# Patient Record
Sex: Male | Born: 1965 | Race: White | Hispanic: No | Marital: Single | Smoking: Current every day smoker
Health system: Southern US, Community
[De-identification: ages and names within clinical notes are randomized; demographics above are authoritative.]

## PROBLEM LIST (undated history)

## (undated) DIAGNOSIS — M1990 Unspecified osteoarthritis, unspecified site: Secondary | ICD-10-CM

## (undated) DIAGNOSIS — J45909 Unspecified asthma, uncomplicated: Secondary | ICD-10-CM

## (undated) DIAGNOSIS — F32A Depression, unspecified: Secondary | ICD-10-CM

## (undated) DIAGNOSIS — R011 Cardiac murmur, unspecified: Secondary | ICD-10-CM

## (undated) DIAGNOSIS — Z7289 Other problems related to lifestyle: Secondary | ICD-10-CM

## (undated) DIAGNOSIS — F419 Anxiety disorder, unspecified: Secondary | ICD-10-CM

## (undated) DIAGNOSIS — F329 Major depressive disorder, single episode, unspecified: Secondary | ICD-10-CM

## (undated) HISTORY — PX: FRACTURE SURGERY: SHX138

## (undated) HISTORY — PX: HERNIA REPAIR: SHX51

## (undated) HISTORY — PX: ABDOMINAL SURGERY: SHX537

---

## 2004-06-30 ENCOUNTER — Emergency Department: Payer: Self-pay | Admitting: Emergency Medicine

## 2015-01-12 ENCOUNTER — Emergency Department: Payer: Medicaid Other

## 2015-01-12 ENCOUNTER — Encounter: Payer: Self-pay | Admitting: Emergency Medicine

## 2015-01-12 ENCOUNTER — Emergency Department
Admission: EM | Admit: 2015-01-12 | Discharge: 2015-01-18 | Disposition: A | Discharge status: Other Acute Inpatient Hospital | Payer: Medicaid Other | Attending: Emergency Medicine | Admitting: Emergency Medicine

## 2015-01-12 ENCOUNTER — Ambulatory Visit
Admission: EM | Admit: 2015-01-12 | Discharge: 2015-01-12 | Disposition: A | Discharge status: Home / Self Care | Payer: Medicaid Other | Attending: Emergency Medicine | Admitting: Emergency Medicine

## 2015-01-12 ENCOUNTER — Ambulatory Visit: Payer: Medicaid Other | Admitting: Certified Registered"

## 2015-01-12 ENCOUNTER — Encounter
Admission: EM | Disposition: A | Discharge status: Still In Hospital | Payer: Self-pay | Source: Home / Self Care | Attending: Emergency Medicine

## 2015-01-12 ENCOUNTER — Encounter
Admission: EM | Disposition: A | Discharge status: Other Acute Inpatient Hospital | Payer: Self-pay | Source: Home / Self Care | Attending: Emergency Medicine

## 2015-01-12 ENCOUNTER — Ambulatory Visit: Admit: 2015-01-12 | Payer: Self-pay | Admitting: Gastroenterology

## 2015-01-12 DIAGNOSIS — T182XXA Foreign body in stomach, initial encounter: Secondary | ICD-10-CM | POA: Insufficient documentation

## 2015-01-12 DIAGNOSIS — S41111A Laceration without foreign body of right upper arm, initial encounter: Secondary | ICD-10-CM

## 2015-01-12 DIAGNOSIS — F99 Mental disorder, not otherwise specified: Secondary | ICD-10-CM | POA: Diagnosis present

## 2015-01-12 DIAGNOSIS — Y9389 Activity, other specified: Secondary | ICD-10-CM | POA: Insufficient documentation

## 2015-01-12 DIAGNOSIS — S51811A Laceration without foreign body of right forearm, initial encounter: Secondary | ICD-10-CM | POA: Diagnosis not present

## 2015-01-12 DIAGNOSIS — Z538 Procedure and treatment not carried out for other reasons: Secondary | ICD-10-CM | POA: Diagnosis not present

## 2015-01-12 DIAGNOSIS — F603 Borderline personality disorder: Secondary | ICD-10-CM

## 2015-01-12 DIAGNOSIS — S41101A Unspecified open wound of right upper arm, initial encounter: Secondary | ICD-10-CM | POA: Insufficient documentation

## 2015-01-12 DIAGNOSIS — Y92149 Unspecified place in prison as the place of occurrence of the external cause: Secondary | ICD-10-CM | POA: Insufficient documentation

## 2015-01-12 DIAGNOSIS — X58XXXA Exposure to other specified factors, initial encounter: Secondary | ICD-10-CM | POA: Diagnosis not present

## 2015-01-12 DIAGNOSIS — J45909 Unspecified asthma, uncomplicated: Secondary | ICD-10-CM | POA: Diagnosis not present

## 2015-01-12 DIAGNOSIS — T189XXA Foreign body of alimentary tract, part unspecified, initial encounter: Secondary | ICD-10-CM

## 2015-01-12 DIAGNOSIS — Z720 Tobacco use: Secondary | ICD-10-CM | POA: Diagnosis not present

## 2015-01-12 DIAGNOSIS — Y998 Other external cause status: Secondary | ICD-10-CM | POA: Insufficient documentation

## 2015-01-12 DIAGNOSIS — Z7289 Other problems related to lifestyle: Secondary | ICD-10-CM

## 2015-01-12 DIAGNOSIS — F17210 Nicotine dependence, cigarettes, uncomplicated: Secondary | ICD-10-CM | POA: Insufficient documentation

## 2015-01-12 DIAGNOSIS — F1210 Cannabis abuse, uncomplicated: Secondary | ICD-10-CM

## 2015-01-12 HISTORY — DX: Other problems related to lifestyle: Z72.89

## 2015-01-12 LAB — CBC WITH DIFFERENTIAL/PLATELET
Basophils Absolute: 0.1 10*3/uL (ref 0–0.1)
Basophils Relative: 1 %
EOS ABS: 0.2 10*3/uL (ref 0–0.7)
EOS PCT: 3 %
HCT: 43.1 % (ref 40.0–52.0)
Hemoglobin: 14.3 g/dL (ref 13.0–18.0)
LYMPHS PCT: 12 %
Lymphs Abs: 0.8 10*3/uL — ABNORMAL LOW (ref 1.0–3.6)
MCH: 26.7 pg (ref 26.0–34.0)
MCHC: 33.3 g/dL (ref 32.0–36.0)
MCV: 80.1 fL (ref 80.0–100.0)
MONOS PCT: 7 %
Monocytes Absolute: 0.4 10*3/uL (ref 0.2–1.0)
Neutro Abs: 4.8 10*3/uL (ref 1.4–6.5)
Neutrophils Relative %: 77 %
PLATELETS: 215 10*3/uL (ref 150–440)
RBC: 5.38 MIL/uL (ref 4.40–5.90)
RDW: 19.7 % — ABNORMAL HIGH (ref 11.5–14.5)
WBC: 6.2 10*3/uL (ref 3.8–10.6)

## 2015-01-12 LAB — COMPREHENSIVE METABOLIC PANEL
ALBUMIN: 4.2 g/dL (ref 3.5–5.0)
ALT: 15 U/L — AB (ref 17–63)
AST: 20 U/L (ref 15–41)
Alkaline Phosphatase: 52 U/L (ref 38–126)
Anion gap: 5 (ref 5–15)
BUN: 14 mg/dL (ref 6–20)
CHLORIDE: 107 mmol/L (ref 101–111)
CO2: 26 mmol/L (ref 22–32)
CREATININE: 1.03 mg/dL (ref 0.61–1.24)
Calcium: 9.4 mg/dL (ref 8.9–10.3)
GFR calc Af Amer: >60 mL/min (ref >60)
GFR calc non Af Amer: >60 mL/min (ref >60)
GLUCOSE: 103 mg/dL — AB (ref 65–99)
POTASSIUM: 4 mmol/L (ref 3.5–5.1)
SODIUM: 138 mmol/L (ref 135–145)
Total Bilirubin: 0.8 mg/dL (ref 0.3–1.2)
Total Protein: 6.9 g/dL (ref 6.5–8.1)

## 2015-01-12 SURGERY — ESOPHAGOGASTRODUODENOSCOPY (EGD) WITH PROPOFOL
Anesthesia: General

## 2015-01-12 MED ORDER — MIDAZOLAM HCL 2 MG/2ML IJ SOLN
INTRAMUSCULAR | Status: DC | PRN
  Administered 2015-01-12: 2 mg via INTRAVENOUS

## 2015-01-12 MED ORDER — SUCCINYLCHOLINE CHLORIDE 20 MG/ML IJ SOLN
INTRAMUSCULAR | Status: DC | PRN
  Administered 2015-01-12: 85 mg via INTRAVENOUS

## 2015-01-12 MED ORDER — LIDOCAINE HCL (CARDIAC) 20 MG/ML IV SOLN
INTRAVENOUS | Status: DC | PRN
  Administered 2015-01-12: 85 mg via INTRAVENOUS

## 2015-01-12 MED ORDER — ETOMIDATE 2 MG/ML IV SOLN
INTRAVENOUS | Status: DC | PRN
  Administered 2015-01-12: 14 mg via INTRAVENOUS

## 2015-01-12 MED ORDER — SODIUM CHLORIDE 0.9 % IV SOLN
INTRAVENOUS | Status: DC
  Administered 2015-01-12 (×2): via INTRAVENOUS

## 2015-01-12 MED ORDER — BUPROPION HCL ER (SR) 100 MG PO TB12
200.0000 mg | ORAL_TABLET | Freq: Two times a day (BID) | ORAL | Status: DC
  Administered 2015-01-13 – 2015-01-18 (×12): 200 mg via ORAL
  Filled 2015-01-12 (×20): qty 2

## 2015-01-12 MED ORDER — FENTANYL CITRATE (PF) 100 MCG/2ML IJ SOLN
INTRAMUSCULAR | Status: DC | PRN
  Administered 2015-01-12: 50 ug via INTRAVENOUS

## 2015-01-12 MED ORDER — SODIUM CHLORIDE 0.9 % IV SOLN
INTRAVENOUS | Status: DC
Start: 2015-01-12 — End: 2015-01-12
  Administered 2015-01-12: 1000 mL via INTRAVENOUS

## 2015-01-12 NOTE — ED Notes (Signed)
Handcuffs left hand, cap refill < 3 sec, skin color WDL, warm and intact 

## 2015-01-12 NOTE — ED Notes (Signed)
Pt

## 2015-01-12 NOTE — ED Notes (Signed)
SOC camera set up in room, officers at bedside, pt resting in bed

## 2015-01-12 NOTE — ED Notes (Signed)
Handcuffs on pt, officers at bedside, cap refill <3 secs, warm temp, skin color WDL

## 2015-01-12 NOTE — H&P (Signed)
  Primary Care Physician:  No primary care provider on file.  Pre-Procedure History & Physical: HPI:  Vincent Cruz is a 49 y.o. male is here for an endoscopy.   Past Medical History  Diagnosis Date  . Self mutilating behavior     History reviewed. No pertinent past surgical history.  Prior to Admission medications   Medication Sig Start Date End Date Taking? Authorizing Provider  buPROPion (WELLBUTRIN SR) 200 MG 12 hr tablet Take 200 mg by mouth 2 (two) times daily.    Historical Provider, MD    Allergies as of 01/12/2015 - Review Complete 01/12/2015  Allergen Reaction Noted  . Propofol  01/12/2015    History reviewed. No pertinent family history.  Social History   Social History  . Marital Status: Unknown    Spouse Name: N/A  . Number of Children: N/A  . Years of Education: N/A   Occupational History  . Not on file.   Social History Main Topics  . Smoking status: Current Every Day Smoker  . Smokeless tobacco: Not on file  . Alcohol Use: No  . Drug Use: Not on file  . Sexual Activity: Not on file   Other Topics Concern  . Not on file   Social History Narrative     Physical Exam: BP 120/74 mmHg  Pulse 84  Temp(Src) 97.9 F (36.6 C) (Oral)  Resp 14  Ht  (1.651 m)  Wt 58.968 kg (130 lb)  BMI 21.63 kg/m2  SpO2 97% General:   Alert,  pleasant and cooperative in NAD Head:  Normocephalic and atraumatic. Neck:  Supple; no masses or thyromegaly. Lungs:  Clear throughout to auscultation.    Heart:  Regular rate and rhythm. Abdomen:  Soft, nontender and nondistended. Normal bowel sounds, without guarding, and without rebound.   Neurologic:  Alert and  oriented x4;  grossly normal neurologically.  Impression/Plan: Vincent Cruz is here for an endoscopy to be performed for swallowed razor blade  Risks, benefits, limitations, and alternatives regarding  endoscopy have been reviewed with the patient.  Questions have been answered.  All parties  agreeable.   Elnita Maxwell, MD  01/12/2015, 3:51 PM

## 2015-01-12 NOTE — ED Notes (Signed)
Was seen earlier today after cutting self and swallowing a razor blade. Was taken to endo and then refused was brought back to ed for additional treatment

## 2015-01-12 NOTE — Op Note (Signed)
[facility] Gastroenterology Patient Name: Vincent Cruz Procedure Date: 01/12/2015 3:46 PM MRN: [id number] Account #: [id number] Date of Birth: 1965-05-14 Admit Type: Ambulatory Age: 49 Room: [facility] ENDO ROOM 4 Gender: Male Note Status: Finalized Procedure:         Upper GI endoscopy Indications:       Foreign body in the stomach ( razor blade confirmed on                     x-ray) Patient Profile:   This is a 49 year old male. Providers:         Rhona Raider. Shelle Iron, MD Referring MD:      Sallye Lat Md, MD (Referring MD) Medicines:         General Anesthesia Complications:     No immediate complications. Procedure:         Pre-Anesthesia Assessment:                    - Prior to the procedure, a History and Physical was                     performed, and patient medications, allergies and                     sensitivities were reviewed. The patient's tolerance of                     previous anesthesia was reviewed.                    After obtaining informed consent, the endoscope was passed                     under direct vision. Throughout the procedure, the                     patient's blood pressure, pulse, and oxygen saturations                     were monitored continuously. The Olympus GIF-160 endoscope                     (S#. [id number]) was introduced through the mouth, and                     advanced to the second part of duodenum. The upper GI                     endoscopy was accomplished without difficulty. The patient                     tolerated the procedure well. Findings:      The esophagus was normal.      Razor blade wrapped in plastic was found in the gastric fundus. Removed       with endoscope hood and snare. Razor sent to pathology for documentation.      The examined duodenum was normal. Impression:        - Normal esophagus.                    - Razor blade wrapped in plastic was found in the stomach.                    Removed with  endoscope hood and snare. Geographical information systems officer  sent to                     pathology for documentation.                    - Normal examined duodenum.                    - No specimens collected. Recommendation:    - Observe patient in GI recovery unit.                    - Resume regular diet.                    - Continue present medications. Procedure Code(s): --- Professional ---                    [phone], Esophagogastroduodenoscopy, flexible, transoral;                     diagnostic, including collection of specimen(s) by                     brushing or washing, when performed (separate procedure) CPT copyright 2014 American Medical Association. All rights reserved. The codes documented in this report are preliminary and upon coder review may  be revised to meet current compliance requirements. Kathalene Frames, MD 01/12/2015 4:22:42 PM This report has been signed electronically. Number of Addenda: 0 Note Initiated On: 01/12/2015 3:46 PM      [facility]

## 2015-01-12 NOTE — ED Notes (Signed)
Called SOC talked to [facility]  For psych eval.  [phone]

## 2015-01-12 NOTE — ED Notes (Signed)
Pt changed into burgundy scrubs, police at bedSide, pt under police custody.  Pt awaiting to be seen by psych in am.  States he is going to hurt himself if he goes back to jail.

## 2015-01-12 NOTE — Anesthesia Preprocedure Evaluation (Signed)
Anesthesia Evaluation  Patient identified by MRN, date of birth, ID band Patient awake    Reviewed: Allergy & Precautions, H&P , NPO status , Patient's Chart, lab work & pertinent test results, reviewed documented beta blocker date and time   History of Anesthesia Complications Negative for: history of anesthetic complications  Airway Mallampati: I  TM Distance: >3 FB Neck ROM: full    Dental no notable dental hx. (+) Teeth Intact   Pulmonary asthma , neg sleep apnea, neg recent URI, Current Smoker,    Pulmonary exam normal breath sounds clear to auscultation       Cardiovascular Exercise Tolerance: Good negative cardio ROS Normal cardiovascular exam Rhythm:regular Rate:Normal     Neuro/Psych negative neurological ROS  negative psych ROS   GI/Hepatic Neg liver ROS, GERD  ,  Endo/Other  negative endocrine ROS  Renal/GU negative Renal ROS  negative genitourinary   Musculoskeletal   Abdominal   Peds  Hematology negative hematology ROS (+)   Anesthesia Other Findings Past Medical History:   Self mutilating behavior                                     Reproductive/Obstetrics negative OB ROS                             Anesthesia Physical Anesthesia Plan  ASA: II  Anesthesia Plan: General   Post-op Pain Management:    Induction:   Airway Management Planned:   Additional Equipment:   Intra-op Plan:   Post-operative Plan:   Informed Consent: I have reviewed the patients History and Physical, chart, labs and discussed the procedure including the risks, benefits and alternatives for the proposed anesthesia with the patient or authorized representative who has indicated his/her understanding and acceptance.   Dental Advisory Given  Plan Discussed with: Anesthesiologist, CRNA and Surgeon  Anesthesia Plan Comments:         Anesthesia Quick Evaluation

## 2015-01-12 NOTE — ED Notes (Signed)
Was seen earlier after swallowing a FB. Pt refused endo. And was returned to ED with [facility]. Pt was placed on IVC by the sheriff dept. 3:02 PM states that he is ready to go to ENDO

## 2015-01-12 NOTE — Progress Notes (Signed)
Pt is refusing EGD.  I explained that this is an emergency and if the blade passes into the small bowel he will require surgery.   He still refused EGD.   The prison guards are applyihg for court order and I made them awere this is an emergency.  We will have to await court order before going ahead with EGD since he is refusing.

## 2015-01-12 NOTE — ED Provider Notes (Addendum)
-----------------------------------------   7:00 PM on 01/12/2015 -----------------------------------------  This patient was initially seen by my colleague, Vincent Cruz, for the ingestion of a razor blade and for a laceration to the right arm. Please see his initial H&P for further background and details.  Vincent Cruz had cared for the patient and arranged for the patient to receive endoscopy to remove the razor blade in his intestinal tract. This was completed earlier. After endoscopy, the patient was returned to the emergency department for completion of his treatment. Vincent Cruz had asked that the laceration be closed upon the patient's return.  I seems care of this patient at approximately 6 PM. The patient at that time was alert, communicative, and no acute distress. He was interactive with a normal affect. He made some jokes, and denied any thoughts of self-harm.  Of note, the patient has multiple scars on the left arm that are due to self-inflicted wounds. These are all well-healed.  The patient does have a laceration on the right forearm that requires closure.  In addition to attention for the laceration, the patient has been placed under an involuntary psychiatric commitment by the prison staff. We will facilitate a further psychiatric evaluation.   LACERATION REPAIR Performed by: Vincent Cruz Authorized by: Vincent Cruz Consent: Verbal consent obtained. Risks and benefits: risks, benefits and alternatives were discussed Consent given by: patient Patient identity confirmed: provided demographic data Prepped and Draped in normal sterile fashion Wound explored  Laceration Location: Right forearm  Laceration Length: 7 cm  No Foreign Bodies seen or palpated  Anesthesia: None   Irrigation method: syringe Amount of cleaning: standard  Skin closure: With staples   Number of staples: 7   Technique: Simple staples   Patient tolerance: Patient tolerated the  procedure well with no immediate complications.  Psychiatric consult pending: ________________________________________________   ----------------------------------------- 9:41 PM on 01/12/2015 -----------------------------------------  Patient has been alert, interactive, cooperative, and ambulatory to the toilet with prison guards, in the emergency department.  I have just spoke with the psychiatrist from specialist on call who will speak with the patient via computer to assess his stability and ability to return to prison.  I will last my colleague, Vincent Cruz,  to assume care of this patient at this time pending psychiatric evaluation.   Diagnosis: Foreign body ingestion, esophageal Laceration, right arm, self-inflicted    Vincent Ramus, MD 01/12/15 2142  Vincent Ramus, MD 01/12/15 2231

## 2015-01-12 NOTE — ED Notes (Signed)
BEHAVIORAL HEALTH ROUNDING Patient sleeping: No. Patient alert and oriented: yes Behavior appropriate: Yes.  ;  Nutrition and fluids offered: Yes  Toileting and hygiene offered: Yes  Sitter present: yes Law enforcement present: Yes  

## 2015-01-12 NOTE — ED Provider Notes (Signed)
[facility] Emergency Department Provider Note  ____________________________________________  Time seen: Approximately 10:56 AM  I have reviewed the triage vital signs and the nursing notes.   HISTORY  Chief Complaint Extremity Laceration and Swallowed Foreign Body    HPI Djon Tith is a 49 y.o. male patient reports he's discussed with conditions in the jail so he took a razor blade and cut his right arm and then he swallowed a razor blade. He did this today. Patient reports his last tetanus shot was 2 years ago he is not having any pain.   No past medical history on file.  Patient reports past medical history significant for asthma  There are no active problems to display for this patient.   No past surgical history on file.  No current outpatient prescriptions on file.  Allergies Review of patient's allergies indicates no known allergies.  History reviewed. No pertinent family history.  Social History Social History  Substance Use Topics  . Smoking status: Current Every Day Smoker  . Smokeless tobacco: None  . Alcohol Use: None    Review of Systems Constitutional: No fever/chills Eyes: No visual changes. ENT: No sore throat. Cardiovascular: Denies chest pain. Respiratory: Denies shortness of breath. Gastrointestinal: No abdominal pain.  No nausea, no vomiting.  No diarrhea.  No constipation. Genitourinary: Negative for dysuria. Musculoskeletal: Negative for back pain. Skin: Negative for rash.  10-point ROS otherwise negative.  ____________________________________________   PHYSICAL EXAM:  VITAL SIGNS: ED Triage Vitals  Enc Vitals Group     BP 01/12/15 1009 126/92 mmHg     Pulse Rate 01/12/15 1009 103     Resp 01/12/15 1009 16     Temp 01/12/15 1009 98.2 F (36.8 C)     Temp src --      SpO2 01/12/15 1009 94 %     Weight --      Height --      Head Cir --      Peak Flow --      Pain Score 01/12/15 1009 3     Pain Loc  --      Pain Edu? --      Excl. in GC? --     Constitutional: Alert and oriented. Well appearing and in no acute distress. Eyes: Conjunctivae are normal. PERRL. EOMI. Head: Atraumatic. Nose: No congestion/rhinnorhea. Mouth/Throat: Mucous membranes are moist.   Neck: No stridor. Cardiovascular: Normal rate, regular rhythm. Grossly normal heart sounds.  Good peripheral circulation. Respiratory: Normal respiratory effort.  No retractions. Lungs CTAB. Gastrointestinal: Soft and nontender. No distention. No abdominal bruits. No CVA tenderness. Musculoskeletal: No lower extremity tenderness nor edema.  No joint effusions. Neurologic:  Normal speech and language. No gross focal neurologic deficits are appreciated. No gait instability. Skin:  Skin is warm, dry and intact. No rash noted. Psychiatric: Mood and affect are normal. Speech and behavior are normal.  ____________________________________________   LABS (all labs ordered are listed, but only abnormal results are displayed)  Labs Reviewed  COMPREHENSIVE METABOLIC PANEL  CBC WITH DIFFERENTIAL/PLATELET   ____________________________________________  EKG   ____________________________________________  RADIOLOGY  Chest x-ray and belly x-ray both show razor blade in the stomach ____________________________________________   PROCEDURES Discussed with gastroenterologist nurse he will come in and do an endoscopy on this gentleman remove the razor blade around lunchtime. He is currently doing a colonoscopy.  ____________________________________________   INITIAL IMPRESSION / ASSESSMENT AND PLAN / ED COURSE  Pertinent labs & imaging results that were available  during my care of the patient were reviewed by me and considered in my medical decision making (see chart for details). patient was taken up to the OR was unable to close the laceration the arm I will sign this out      ____________________________________________   FINAL CLINICAL IMPRESSION(S) / ED DIAGNOSES  Final diagnoses:  Ingestion of foreign body      Arnaldo Natal, MD 01/12/15 1649

## 2015-01-12 NOTE — ED Notes (Signed)
Dr.Kaminski in with pt .

## 2015-01-12 NOTE — ED Notes (Addendum)
Pt here for [place].  Pt unhappy with jail conditions so he cut himself with a razor and then swallowed it.  This is not his first time doing this.  Denies HI/SI. States he was seeking to draw attention to "conditions in jail"

## 2015-01-12 NOTE — ED Provider Notes (Signed)
-----------------------------------------   10:59 PM on 01/12/2015 -----------------------------------------  Patient has been seen by specialist on call who recommends inpatient admission. We will continue the involuntary commitment to the patient can be evaluated by psychiatry in the emergency department, and possibly place into an appropriate facility.  Minna Antis, MD 01/12/15 2300

## 2015-01-12 NOTE — Anesthesia Procedure Notes (Signed)
Procedure Name: Intubation Date/Time: 01/12/2015 3:57 PM Performed by: Irving Burton Pre-anesthesia Checklist: Patient identified, Emergency Drugs available, Suction available, Patient being monitored and Timeout performed Patient Re-evaluated:Patient Re-evaluated prior to inductionOxygen Delivery Method: Circle system utilized Preoxygenation: Pre-oxygenation with 100% oxygen Intubation Type: IV induction Ventilation: Mask ventilation without difficulty Laryngoscope Size: Mac and 3 Grade View: Grade II Tube type: Oral Tube size: 7.0 mm Number of attempts: 1 Airway Equipment and Method: Stylet Secured at: 22 cm Tube secured with: Tape Dental Injury: Teeth and Oropharynx as per pre-operative assessment

## 2015-01-12 NOTE — ED Notes (Signed)
Received patient back from Endo. [employer] deputy at bedside

## 2015-01-12 NOTE — ED Notes (Signed)
Report Endo  Transported pt via stretcher with [employer]

## 2015-01-12 NOTE — ED Notes (Addendum)
Handcuffs left hand, cap refill < 3 sec, skin color WDL, warm and intact

## 2015-01-12 NOTE — Transfer of Care (Signed)
Immediate Anesthesia Transfer of Care Note  Patient: Vincent Cruz  Procedure(s) Performed: Procedure(s): ESOPHAGOGASTRODUODENOSCOPY (EGD) WITH PROPOFOL (N/A)  Patient Location: PACU  Anesthesia Type:General  Level of Consciousness: sedated  Airway & Oxygen Therapy: Patient Spontanous Breathing and Patient connected to face mask oxygen  Post-op Assessment: Report given to RN and Post -op Vital signs reviewed and stable  Post vital signs: stable  Last Vitals:  Filed Vitals:   01/12/15 1625  BP: 113/73  Pulse: 82  Temp: 37.2 C  Resp: 18    Complications: No apparent anesthesia complications

## 2015-01-13 MED ORDER — ALUM & MAG HYDROXIDE-SIMETH 200-200-20 MG/5ML PO SUSP
ORAL | Status: AC
  Administered 2015-01-13: 30 mL via ORAL
  Filled 2015-01-13: qty 30

## 2015-01-13 MED ORDER — ONDANSETRON HCL 4 MG/2ML IJ SOLN: 4.0000 mg | Freq: Once | INTRAMUSCULAR | Status: DC

## 2015-01-13 MED ORDER — ALUM & MAG HYDROXIDE-SIMETH 200-200-20 MG/5ML PO SUSP
30.0000 mL | Freq: Once | ORAL | Status: AC
  Administered 2015-01-13: 30 mL via ORAL

## 2015-01-13 NOTE — ED Notes (Signed)

## 2015-01-13 NOTE — ED Notes (Signed)
Pt resting with eyes closed.

## 2015-01-13 NOTE — ED Notes (Signed)
Pt watching tv

## 2015-01-13 NOTE — ED Notes (Signed)
Resumed care from [place], RN. Pt resting but awakened to eat breakfast. A&O and denies pain at this time.

## 2015-01-13 NOTE — ED Notes (Signed)
Pt watching tv, no change in  Condition, police at bedside.

## 2015-01-13 NOTE — ED Notes (Signed)
Pt given dinner tray and a cup of coffee

## 2015-01-13 NOTE — BHH Counselor (Signed)
Writer informed by Pt RN (Raquel) that Pt has received [facility] consult and it is recommended that Pt's IVC be continued and he be referred for inpatient psychiatric hospitilizaton for further stabilization. TTS Consult not ordered prior to [facility] and Pt is sleeping at this time.

## 2015-01-13 NOTE — BHH Counselor (Addendum)
Pt referred to [facility] [place]

## 2015-01-14 LAB — SURGICAL PATHOLOGY

## 2015-01-14 NOTE — ED Notes (Signed)
Patient given electric razor to shave face/head. Officer at bedside

## 2015-01-14 NOTE — ED Notes (Signed)
BEHAVIORAL HEALTH ROUNDING Patient sleeping: No. Patient alert and oriented: yes Behavior appropriate: Yes.  ; If no, describe:  Nutrition and fluids offered: Yes  Toileting and hygiene offered: Yes  Sitter present: not applicable Law enforcement present: Yes  

## 2015-01-14 NOTE — ED Notes (Signed)
BEHAVIORAL HEALTH ROUNDING Patient sleeping: No. Patient alert and oriented: yes Behavior appropriate: Yes.   Nutrition and fluids offered: Yes  Toileting and hygiene offered: Yes  Sitter present: no Law enforcement present: Yes   ENVIRONMENTAL ASSESSMENT Potentially harmful objects out of patient reach: Yes.   Personal belongings secured: Yes.   Patient dressed in hospital provided attire only: Yes.   Plastic bags out of patient reach: Yes.   Patient care equipment (cords, cables, call bells, lines, and drains) shortened, removed, or accounted for: Yes.   Equipment and supplies removed from bottom of stretcher: Yes.   Potentially toxic materials out of patient reach: Yes.   Sharps container removed or out of patient reach: Yes.     

## 2015-01-14 NOTE — Anesthesia Postprocedure Evaluation (Signed)
  Anesthesia Post-op Note  Patient: Vincent Cruz  Procedure(s) Performed: Procedure(s): ESOPHAGOGASTRODUODENOSCOPY (EGD) WITH PROPOFOL (N/A)  Anesthesia type:General  Patient location: PACU  Post pain: Pain level controlled  Post assessment: Post-op Vital signs reviewed, Patient's Cardiovascular Status Stable, Respiratory Function Stable, Patent Airway and No signs of Nausea or vomiting  Post vital signs: Reviewed and stable  Last Vitals:  Filed Vitals:   01/14/15 0900  BP: 97/68  Pulse:   Temp:   Resp:     Level of consciousness: awake, alert  and patient cooperative  Complications: No apparent anesthesia complications

## 2015-01-14 NOTE — ED Notes (Signed)
BEHAVIORAL HEALTH ROUNDING  Patient sleeping: No.  Patient alert and oriented: yes  Behavior appropriate: Yes. ; If no, describe: Pt is cooperative.  Nutrition and fluids offered: Yes  Toileting and hygiene offered: Yes  Sitter present: yes  Law enforcement present: Yes   

## 2015-01-14 NOTE — ED Notes (Signed)
MD Clapacs at bedside  

## 2015-01-14 NOTE — ED Notes (Signed)
BEHAVIORAL HEALTH ROUNDING Patient sleeping: No. Patient alert and oriented: yes Behavior appropriate: Yes.   Nutrition and fluids offered: Yes  Toileting and hygiene offered: Yes  Sitter present: no Law enforcement present: Yes   Pt resting in bed quietly, officer at bedside

## 2015-01-14 NOTE — ED Provider Notes (Signed)
-----------------------------------------   5:10 AM on 01/14/2015 -----------------------------------------   BP 107/82 mmHg  Pulse 74  Temp(Src) 98.2 F (36.8 C) (Oral)  Resp 16  Ht  (1.651 m)  Wt 130 lb (58.968 kg)  BMI 21.63 kg/m2  SpO2 100%  No acute events since last update.  Acting appropriately.  Disposition is pending per Psychiatry/Behavioral Medicine team recommendations.     Phineas Semen, MD 01/14/15 [phone]

## 2015-01-14 NOTE — ED Notes (Addendum)
Pt ion stretcher in [facility] area with bilateral shackles and left handcuff in place.  No complaints or concerns voiced at this time. No abnormal behavior noted at this time. Will continue to monitor with q15 min checks and security camera monitoring. [employer] officer in area. Pt is stating "the lady in charge said that if a room comes open that I will get a room, you can ask him (point to sheriff with pt)"  I informed pt that 2 of the pt's will be here for a while, and that I do not know if or when a room will open up. Verified with Charge RN Raquel and the current plan is to keep the pt in the [facility] bed. Due to pt possibily swallowing the razor blade to get out of jail and have a comfortable room to staying in until he is placed inpatient.

## 2015-01-14 NOTE — ED Notes (Signed)
dietery called for pt breakfast tray

## 2015-01-14 NOTE — ED Notes (Signed)
BEHAVIORAL HEALTH ROUNDING  Patient sleeping: No.  Patient alert and oriented: yes  Behavior appropriate: Yes.  Nutrition and fluids offered: Yes. Patient ate breakfast. Toileting and hygiene offered: Yes  Sitter present: no  Law enforcement present: Yes

## 2015-01-14 NOTE — Progress Notes (Signed)
Client information has been faxed to [employer] @ 1900; faxed to [facility] @ 1910 and spoke with Tobi Bastos @ [facility] to make the referral @ 1915; spoke with [facility] @ [employer] @ [phone]; received a Tracking number from [facility] [place] [facility] with [employer] @ [phone]; and faxed the Tracking Number to [facility] @ 2315.

## 2015-01-14 NOTE — BHH Counselor (Signed)
Writer spoke with ER MD (Dr. Fanny Bien) in regards the TTS Consult and not being able to do too much for the patient, because he is in the custody of the [facility] Department (in jail).   Writer spoke with RHA, [employer] (Micheal Covington-[phone]) about patient and his knowledge of the patient. He stated, several attempts was made by different people to get the patient bond reduced, in order that he may be released from jail and he placed inpatient somewhere. However, the judge refused to lower the bond. Thus, he was placed under IVC while in the ER. Due to the "Language" on the IVC, he was unable to get him transferred directly to [facility].  [facility] provider the writer with the Captains Cell phone number and advised to talk with him for the specifics.  Writer contacted Cathlyn Parsons (Cell phone not in this note, per [employer]). Per Cathlyn Parsons, the patient is still in their Custody. They made attempts to get the bail reduced. He states, the judge will not reduce the bond due to patient has a history of assaultive behaviors and multiple incarcerations on the prison level.  Their is also concerns of the patient, ingesting the razor as a means to get out of jail. Writer shared with the captain, In the event the patient is Psychiatrically Cleared, he is to return back to the jail and remain under the custody of the [facility] Department. In the event the patient is recommended inpatient treatment [facility] is the only option. Writer explained to the [place], due to him being in their Custody, no facility will consider him.  Per Psych MD (Dr. Toni Amend), patient recommendation will be for inpatient treatment. Writer was advised to work on placement with [facility].  Writer called and spoke with [facility] ([phone]), about the process of patient being admitted to there facility, considering the patient is in the custody of [facility] Department and  still under bond.  Writer was advised to refer the patient by sending documentation from the Hospital and the [facility] Department. It will be treated like a regular referral and will be prioritized based on the supporting documentation.    Writer and called and spoke [employer] [phone]) and the process of getting the tracking number for the and approval for the [facility] Referral. They stated, same information for other IVC patient, will be needed. Only difference will be information from the jail will be needed and they will be involved the process as well.  Writer called Cathlyn Parsons back and updated him of the plan of getting the patient placed at [facility]. Also, informed him of the need of supporting documentation about being unable to house him safely within their facility. Also discussed, a Personnel officer from [employer] department will remain with him, the entire time he is in the ER, due to being in the custody Sheriff's department.  Requested documentation from [facility] Department received via fax and a copy placed on patient's paper chart.  [facility] Referral was completed and faxed to [employer] by [facility]). At this time, waiting tracking number, to complete the verbal screening and referral with [facility].

## 2015-01-15 MED ORDER — ACETAMINOPHEN 325 MG PO TABS: 650.0000 mg | ORAL_TABLET | Freq: Once | ORAL | Status: DC

## 2015-01-15 MED ORDER — DIPHENHYDRAMINE HCL 25 MG PO CAPS
50.0000 mg | ORAL_CAPSULE | Freq: Once | ORAL | Status: AC
  Administered 2015-01-15: 50 mg via ORAL
  Filled 2015-01-15: qty 2

## 2015-01-15 MED ORDER — FAMOTIDINE 20 MG PO TABS
20.0000 mg | ORAL_TABLET | Freq: Once | ORAL | Status: AC
  Administered 2015-01-15: 20 mg via ORAL
  Filled 2015-01-15: qty 1

## 2015-01-15 NOTE — ED Notes (Signed)
Return from [employer].  Remains calm and cooperative.

## 2015-01-15 NOTE — ED Notes (Signed)
BEHAVIORAL HEALTH ROUNDING Patient sleeping: No. Patient alert and oriented: yes Behavior appropriate: Yes.  ; If no, describe:  Nutrition and fluids offered: Yes  Toileting and hygiene offered: Yes  Sitter present: yes Law enforcement present: Yes   Pt in hallway, cooperative having pleasant conversations with security. Awaiting [facility] acceptance.

## 2015-01-15 NOTE — ED Notes (Signed)
Pt. Taken out of restraints to use bathroom.

## 2015-01-15 NOTE — ED Notes (Signed)
BEHAVIORAL HEALTH ROUNDING Patient sleeping: Yes   Patient alert and oriented: Yes   Behavior appropriate: Yes   Nutrition and fluids offered: Yes  Toileting and hygiene offered: Yes   Sitter present: Yes  Law enforcement present:  Yes 

## 2015-01-15 NOTE — ED Notes (Signed)
Pt on stretcher in [facility] area with bilateral shackles on ankles and left wrist handcuff in place. [facility] with patient  No complaints or concerns voiced at this time. No abnormal behavior noted at this time. Will continue to monitor with q15 min checks. ODS officer in area.

## 2015-01-15 NOTE — ED Provider Notes (Signed)
-----------------------------------------   7:01 AM on 01/15/2015 -----------------------------------------   Blood pressure 98/78, pulse 91, temperature 97.5 F (36.4 C), temperature source Oral, resp. rate 18, height  (1.651 m), weight 130 lb (58.968 kg), SpO2 97 %.  The patient had no acute events since last update.  Benadryl was given overnight for sleep. Calm and cooperative at this time.  Disposition is pending per Psychiatry/Behavioral Medicine team recommendations.     Irean Hong, MD 01/15/15 [phone]

## 2015-01-15 NOTE — ED Notes (Signed)
Took over care of patient from [place]. Patient currently resting on stretcher. Under police custody. In handcuffs.

## 2015-01-15 NOTE — ED Notes (Signed)
BEHAVIORAL HEALTH ROUNDING Patient sleeping: No  Patient alert and oriented: Yes   Behavior appropriate: Yes   Nutrition and fluids offered: Yes  Toileting and hygiene offered: Yes  Sitter present: Yes  Law enforcement present: Yes  

## 2015-01-15 NOTE — ED Notes (Signed)
BEHAVIORAL HEALTH ROUNDING Patient sleeping: Yes.   Patient alert and oriented: not applicable Behavior appropriate: Yes.    Nutrition and fluids offered: No Toileting and hygiene offered: No Sitter present: q15 minute observations Law enforcement present: Yes [employer] 

## 2015-01-15 NOTE — ED Notes (Signed)
Pt sleeping. 

## 2015-01-15 NOTE — ED Notes (Signed)
BEHAVIORAL HEALTH ROUNDING Patient sleeping: Yes.   Patient alert and oriented: na, pt asleep Behavior appropriate: Yes.  ; If no, describe:  Nutrition and fluids offered: Yes  Toileting and hygiene offered: Yes  Sitter present: yes Law enforcement present: Yes   Pt still in police custody at this time and is awaiting to be seen by psych.

## 2015-01-15 NOTE — ED Notes (Signed)
Pt on stretcher in [facility] [address] area with blanket pulled up over head, left wrist handcuffed to stretcher and bilateral shackles. Counsellor in hallway with pt. No complaints or concerns voiced at this time. No abnormal behavior noted at this time. Will continue to monitor with q15 min checks. ODS officer in area.

## 2015-01-15 NOTE — ED Notes (Signed)
BEHAVIORAL HEALTH ROUNDING Patient sleeping: no  Patient alert and oriented: yes Behavior appropriate: Yes.  ; If no, describe:  Nutrition and fluids offered: Yes  Toileting and hygiene offered: Yes  Sitter present: yes Law enforcement present: Yes  

## 2015-01-15 NOTE — ED Notes (Signed)
Pt on stretcher in 20 H area with bilateral shackles and left handcuff in place. [facility] at bedside. No complaints or concerns voiced at this time. No abnormal behavior noted at this time. Will continue to monitor with q15 min checks. ODS officer in area.

## 2015-01-15 NOTE — ED Notes (Signed)
Patient ambulated to [facility] to take a shower.  Law Enforcement and ED Tech accompanying patient.  Patient calm and cooperative.

## 2015-01-15 NOTE — ED Notes (Signed)
BEHAVIORAL HEALTH ROUNDING Patient sleeping: No. Patient alert and oriented: yes Behavior appropriate: Yes.  ; If no, describe:  Nutrition and fluids offered: Yes  Toileting and hygiene offered: Yes  Sitter present: yes Law enforcement present: Yes  

## 2015-01-15 NOTE — BHH Counselor (Signed)
Counselor contacted [facility] about the referral of the Pt.  Vincent Mccreedy, RN from [facility] had questions about the razor blade removal of the Pt.  Counselor faxed Op Note to Vincent Cruz, Charity fundraiser at [facility].

## 2015-01-15 NOTE — ED Notes (Signed)
BEHAVIORAL HEALTH ROUNDING Patient sleeping: No  Patient alert and oriented: Yes   Behavior appropriate: Yes   Nutrition and fluids offered: Yes  Toileting and hygiene offered: Yes  Sitter present: Yes  Law enforcement present: Yes

## 2015-01-15 NOTE — ED Notes (Signed)
BEHAVIORAL HEALTH ROUNDING Patient sleeping: No. Patient alert and oriented: yes Behavior appropriate: Yes.  ; If no, describe:  Nutrition and fluids offered: Yes  Toileting and hygiene offered: Yes  Sitter present: not applicable Law enforcement present: Yes  

## 2015-01-15 NOTE — ED Notes (Addendum)
BEHAVIORAL HEALTH ROUNDING Patient sleeping: No   Patient alert and oriented: Yes  Behavior appropriate: Yes  Nutrition and fluids offered: Yes   Toileting and hygiene offered: Yes   Sitter present: Yes   Law enforcement present: Yes   Patient relaxed and calm.  Having ongoing conversations with security out in the hall.

## 2015-01-15 NOTE — Consult Note (Signed)
[facility] Face-to-Face Psychiatry Consult   Reason for Consult:  Follow up Referring Physician:  ER Patient Identification: Vincent Cruz MRN:  [id number] Principal Diagnosis: Unspecified Depressive disordee. Diagnosis:  There are no active problems to display for this patient.   Total Time spent with patient: 45 minutes  Subjective:   Vincent Cruz is a 49 y.o. male patient admitted with a long H/O behavior problems . Single and is not employed and is here from [place] after he ingested foreign body ie a razor blade and cut himself with a superficial laceration on his rt arm. Marland Kitchen  HPI:  Pt has been in jail for "credit card theft " and he did not like to be in jail and would rather come here or go to prison. HPI Elements:     Past Medical History:  Past Medical History  Diagnosis Date  . Self mutilating behavior    History reviewed. No pertinent past surgical history. Family History: History reviewed. No pertinent family history. Social History:  History  Alcohol Use No     History  Drug Use Not on file    Social History   Social History  . Marital Status: Unknown    Spouse Name: N/A  . Number of Children: N/A  . Years of Education: N/A   Social History Main Topics  . Smoking status: Current Every Day Smoker  . Smokeless tobacco: None  . Alcohol Use: No  . Drug Use: None  . Sexual Activity: Not Asked   Other Topics Concern  . None   Social History Narrative   Additional Social History:                          Allergies:   Allergies  Allergen Reactions  . Propofol     Labs: No results found for this or any previous visit (from the past 48 hour(s)).  Vitals: Blood pressure 98/78, pulse 91, temperature 97.5 F (36.4 C), temperature source Oral, resp. rate 18, height 5\' 5"  (1.651 m), weight 130 lb (58.968 kg), SpO2 97 %.  Risk to Self: Is patient at risk for suicide?: No Risk to Others:   Prior Inpatient Therapy:   Prior Outpatient Therapy:    Current  Facility-Administered Medications  Medication Dose Route Frequency Provider Last Rate Last Dose  . buPROPion [facility] SR) 12 hr tablet 200 mg  200 mg Oral BID Phineas Semen, MD   200 mg at 01/15/15 0927  . ondansetron (ZOFRAN) injection 4 mg  4 mg Intravenous Once Darci Current, MD   4 mg at 01/13/15 0448   Current Outpatient Prescriptions  Medication Sig Dispense Refill  . buPROPion (WELLBUTRIN SR) 200 MG 12 hr tablet Take 200 mg by mouth 2 (two) times daily.      Musculoskeletal: Strength & Muscle Tone: within normal limits Gait & Station: normal Patient leans: N/A  Psychiatric Specialty Exam: Physical Exam  Nursing note and vitals reviewed.   ROS  Blood pressure 98/78, pulse 91, temperature 97.5 F (36.4 C), temperature source Oral, resp. rate 18, height 5\' 5"  (1.651 m), weight 130 lb (58.968 kg), SpO2 97 %.Body mass index is 21.63 kg/(m^2).  General Appearance: Casual  Eye Contact::  Fair  Speech:  Clear and Coherent  Volume:  Normal  Mood:  Anxious  Affect:  Appropriate  Thought Process:  Circumstantial  Orientation:  Full (Time, Place, and Person)  Thought Content:  NA  Suicidal Thoughts:  No  Homicidal Thoughts:  No  Memory:  Immediate;   Fair Recent;   Fair Remote;   Fair adequate  Judgement:  Fair  Insight:  Fair  Psychomotor Activity:  Normal  Concentration:  Fair  Recall:  Vincent Cruz of Knowledge:Fair  Language: Fair  Akathisia:  none  Handed:  Right  AIMS (if indicated):     Assets:  Communication Skills  ADL's:  Intact  Cognition: WNL  Sleep:      Medical Decision Making: Review of Psycho-Social Stressors (1)  Treatment Plan Summary: Plan Recommend Inpt for further help where it is appropriate.  Plan:  No evidence of imminent risk to self or others at present.   Disposition: as above  Correy Weidner K 01/15/2015 4:01 PM

## 2015-01-15 NOTE — ED Notes (Signed)
BEHAVIORAL HEALTH ROUNDING Patient sleeping: No. Patient alert and oriented: yes Behavior appropriate: Yes.   Nutrition and fluids offered: Yes  Toileting and hygiene offered: Yes  Sitter present: q15 min observations Law enforcement present: Yes [employer]  ENVIRONMENTAL ASSESSMENT Potentially harmful objects out of patient reach: Yes.   Personal belongings secured: Yes.   Patient dressed in hospital provided attire only: Yes.   Plastic bags out of patient reach: Yes.   Patient care equipment (cords, cables, call bells, lines, and drains) shortened, removed, or accounted for: Yes.   Equipment and supplies removed from bottom of stretcher: Yes.   Potentially toxic materials out of patient reach: Yes.   Sharps container removed or out of patient reach: Yes.   ED BHU PLACEMENT JUSTIFICATION Is the patient under IVC or is there intent for IVC: Yes.    Is IVC current? Yes Is the patient medically cleared: Yes.   Is there vacancy in the ED BHU: Yes.   Is the population mix appropriate for patient: No pt is a prisoner of [facility], with armed Sheriff with pt at all times, gun is not allowed in [place] patient area.   Is the patient awaiting placement in inpatient or outpatient setting: Yes.  [facility] Has the patient had a psychiatric consult: Yes.   Survey of unit performed for contraband, proper placement and condition of furniture, tampering with fixtures in bathroom, shower, and each patient room: Yes.  ; Findings: none APPEARANCE/BEHAVIOR Cooperative,calm NEURO ASSESSMENT Orientation: A&O x3 Hallucinations: none noted at this time Speech: Normal Gait: shuffling pt wearing shackles RESPIRATORY ASSESSMENT Breathing Pattern-regular, no respiratory distress noted CARDIOVASCULAR ASSESSMENT Skin color appropriate for age and race GASTROINTESTINAL ASSESSMENT no GI distress noted EXTREMITIES Moves all extremities, no distress noted PLAN OF CARE Provide calm/safe environment.  Vital signs assessed twice daily. ED BHU Assessment once each 12-hour shift. Collaborate with intake RN daily or as condition indicates. Assure the ED provider has rounded once each shift. Provide and encourage hygiene. Provide redirection as needed. Assess for escalating behavior; address immediately and inform ED provider.  Assess family dynamic and appropriateness for visitation as needed: Yes.  Educate the patient/family about BHU procedures/visitation: Yes.

## 2015-01-15 NOTE — ED Notes (Signed)
BEHAVIORAL HEALTH ROUNDING Patient sleeping: No. Patient alert and oriented: yes Behavior appropriate: Yes.   Nutrition and fluids offered: Yes  Toileting and hygiene offered: Yes  Sitter present: q15 min observations Law enforcement present: Yes [employer] 

## 2015-01-15 NOTE — ED Notes (Signed)
BEHAVIORAL HEALTH ROUNDING Patient sleeping: Yes.   Patient alert and oriented: yes Behavior appropriate: Yes.  ; If no, describe:  Nutrition and fluids offered: Yes  Toileting and hygiene offered: Yes  Sitter present: no Law enforcement present: Yes  

## 2015-01-15 NOTE — ED Notes (Signed)
BEHAVIORAL HEALTH ROUNDING Patient sleeping: No. Patient alert and oriented: yes Behavior appropriate: Yes.  ; If no, describe:  Nutrition and fluids offered: Yes  Toile ting and hygiene offered: Yes  Sitter present: yes Law enforcement present: Yes   Pt continues to have ongoing conversations with security out in the hall.

## 2015-01-15 NOTE — BHH Counselor (Signed)
12:30Britta Mccreedy, RN @ [facility] said they would not admit Pt due to his current conviction status. She stated the prison must transport the Pt. To [facility] and then he could go to [facility] from there.  12:45- Counselor spoke to Pt and police officer about Pt status. He is currently being held under bond, awaiting a court day.   12:50- Counselor called Britta Mccreedy @ [facility] back with this information. She requested documentation of  Pt's status  12:55- Counselor called Lt. [place] at [employer]. She is working on documentation.  13:20- Lt. [place] faxed information to counselor regarding Pt's status  13:30- Counselor faxed information regarding Pt's status to Britta Mccreedy, [place] @ [facility] at [phone]  13:45- Counselor called Britta Mccreedy to confirm they have the information regarding status. Britta Mccreedy, RN has handed over the Pt's referral to her supervision, Bubba Hales @ [facility]. Lennox Laity will not be back in the office until Monday, 01/17/15. Jodi's number is [phone] or [phone].

## 2015-01-15 NOTE — ED Notes (Signed)
BEHAVIORAL HEALTH ROUNDING Patient sleeping: Yes.   Patient alert and oriented:no Behavior appropriate: Yes.  ; If no, describe:  Nutrition and fluids offered: Yes  Toileting and hygiene offered: Yes  Sitter present: yes Law enforcement present: Yes  

## 2015-01-15 NOTE — BHH Counselor (Signed)
Counselor contacted Britta Mccreedy, RN intake at [facility]. She confirmed they received the OP Note fax. Pt is on the waiting list at [facility]. Britta Mccreedy, RN stated the timeframe will be at least a week.

## 2015-01-15 NOTE — ED Notes (Signed)
BEHAVIORAL HEALTH ROUNDING Patient sleeping No Patient alert and oriented: Yes  Behavior appropriate: Yes  Nutrition and fluids offered: Yes  Toileting and hygiene offered: Yes  Sitter present: Yes   Law enforcement present: Yes    Patient continues to relaxed and calm and having ongoing conversations with security out in the hall.

## 2015-01-15 NOTE — ED Notes (Signed)
Pt in [facility] area left wrsit handcuffed to stretcher, bilateral ankle shackles. [employer] in hall with pt. No complaints or concerns voiced at this time. No abnormal behavior noted at this time. Will continue to monitor with q15 min checks. ODS officer in area.

## 2015-01-16 MED ORDER — SALINE SPRAY 0.65 % NA SOLN
1.0000 | Freq: Every day | NASAL | Status: DC
  Administered 2015-01-16 – 2015-01-17 (×2): 1 via NASAL

## 2015-01-16 MED ORDER — SALINE SPRAY 0.65 % NA SOLN
1.0000 | Freq: Once | NASAL | Status: AC
  Administered 2015-01-16: 1 via NASAL
  Filled 2015-01-16: qty 44

## 2015-01-16 MED ORDER — FAMOTIDINE 20 MG PO TABS
20.0000 mg | ORAL_TABLET | Freq: Every day | ORAL | Status: DC
Start: 2015-01-17 — End: 2015-01-18
  Administered 2015-01-17 – 2015-01-18 (×2): 20 mg via ORAL
  Filled 2015-01-16 (×2): qty 1

## 2015-01-16 NOTE — ED Notes (Signed)
BEHAVIORAL HEALTH ROUNDING  Patient sleeping: Yes.  Patient alert and oriented: no  Behavior appropriate: Yes. ; If no, describe:  Nutrition and fluids offered: No  Toileting and hygiene offered: No  Sitter present: no  Law enforcement present: Yes   

## 2015-01-16 NOTE — ED Notes (Signed)
BEHAVIORAL HEALTH ROUNDING Patient sleeping: No. Patient alert and oriented: yes Behavior appropriate: Yes.  ; If no, describe:  Nutrition and fluids offered: Yes  Toileting and hygiene offered: Yes  Sitter present: yes Law enforcement present: Yes  

## 2015-01-16 NOTE — ED Notes (Signed)
BEHAVIORAL HEALTH ROUNDING Patient sleeping: No. Patient alert and oriented: yes Behavior appropriate: Yes.  ; If no, describe:  Nutrition and fluids offered: Yes  Toileting and hygiene offered: Yes  Sitter present: yes Law enforcement present: Yes [employer]

## 2015-01-16 NOTE — ED Notes (Signed)
Pt. Out of restraints to use bathroom.

## 2015-01-16 NOTE — BHH Counselor (Signed)
Writer followed up with [facility] ([phone]). Patient referral is pending until Monday (01/17/2015). At this point, referral need to be reviewed by supervisor Bubba Hales) because of the status of his charges. Nothing else needed at this time.

## 2015-01-16 NOTE — ED Notes (Signed)

## 2015-01-16 NOTE — ED Notes (Signed)
Pt denies hallucinations, auditory and visual. Pt denies SI/HI.

## 2015-01-16 NOTE — ED Notes (Signed)
BEHAVIORAL HEALTH ROUNDING Patient sleeping: No. Patient alert and oriented: yes Behavior appropriate: Yes.  ; If no, describe:  Nutrition and fluids offered: Yes  Toileting and hygiene offered: Yes  Sitter present: no Law enforcement present: Yes  

## 2015-01-16 NOTE — ED Notes (Signed)
Guard at bedside. Patient eating breakfast. A/O x4. NAD.

## 2015-01-17 ENCOUNTER — Ambulatory Visit: Payer: Medicaid Other

## 2015-01-17 ENCOUNTER — Ambulatory Visit: Payer: Medicaid Other | Admitting: Anesthesiology

## 2015-01-17 ENCOUNTER — Encounter
Admission: EM | Disposition: A | Discharge status: Other Acute Inpatient Hospital | Payer: Self-pay | Source: Home / Self Care | Attending: Emergency Medicine

## 2015-01-17 DIAGNOSIS — Z7289 Other problems related to lifestyle: Secondary | ICD-10-CM

## 2015-01-17 DIAGNOSIS — T189XXA Foreign body of alimentary tract, part unspecified, initial encounter: Secondary | ICD-10-CM | POA: Insufficient documentation

## 2015-01-17 DIAGNOSIS — T182XXA Foreign body in stomach, initial encounter: Secondary | ICD-10-CM

## 2015-01-17 DIAGNOSIS — F603 Borderline personality disorder: Secondary | ICD-10-CM | POA: Diagnosis not present

## 2015-01-17 DIAGNOSIS — F1210 Cannabis abuse, uncomplicated: Secondary | ICD-10-CM

## 2015-01-17 LAB — CBC WITH DIFFERENTIAL/PLATELET
BASOS PCT: 2 %
Basophils Absolute: 0.1 10*3/uL (ref 0–0.1)
EOS ABS: 0.3 10*3/uL (ref 0–0.7)
EOS PCT: 6 %
HCT: 44.6 % (ref 40.0–52.0)
HEMOGLOBIN: 14.7 g/dL (ref 13.0–18.0)
LYMPHS ABS: 1.4 10*3/uL (ref 1.0–3.6)
Lymphocytes Relative: 25 %
MCH: 27 pg (ref 26.0–34.0)
MCHC: 33 g/dL (ref 32.0–36.0)
MCV: 82 fL (ref 80.0–100.0)
MONOS PCT: 9 %
Monocytes Absolute: 0.5 10*3/uL (ref 0.2–1.0)
NEUTROS PCT: 58 %
Neutro Abs: 3.3 10*3/uL (ref 1.4–6.5)
PLATELETS: 249 10*3/uL (ref 150–440)
RBC: 5.44 MIL/uL (ref 4.40–5.90)
RDW: 19.9 % — ABNORMAL HIGH (ref 11.5–14.5)
WBC: 5.7 10*3/uL (ref 3.8–10.6)

## 2015-01-17 LAB — COMPREHENSIVE METABOLIC PANEL
ALBUMIN: 4 g/dL (ref 3.5–5.0)
ALT: 15 U/L — ABNORMAL LOW (ref 17–63)
ANION GAP: 5 (ref 5–15)
AST: 22 U/L (ref 15–41)
Alkaline Phosphatase: 49 U/L (ref 38–126)
BUN: 14 mg/dL (ref 6–20)
CHLORIDE: 106 mmol/L (ref 101–111)
CO2: 28 mmol/L (ref 22–32)
Calcium: 9.3 mg/dL (ref 8.9–10.3)
Creatinine, Ser: 1.15 mg/dL (ref 0.61–1.24)
GFR calc non Af Amer: >60 mL/min (ref >60)
GLUCOSE: 94 mg/dL (ref 65–99)
Potassium: 4.1 mmol/L (ref 3.5–5.1)
SODIUM: 139 mmol/L (ref 135–145)
Total Bilirubin: 0.6 mg/dL (ref 0.3–1.2)
Total Protein: 6.9 g/dL (ref 6.5–8.1)

## 2015-01-17 SURGERY — REMOVAL, FOREIGN BODY
Anesthesia: General

## 2015-01-17 MED ORDER — ETOMIDATE 2 MG/ML IV SOLN
INTRAVENOUS | Status: DC | PRN
Start: 2015-01-17 — End: 2015-01-17
  Administered 2015-01-17: 14 mg via INTRAVENOUS

## 2015-01-17 MED ORDER — SUCCINYLCHOLINE CHLORIDE 20 MG/ML IJ SOLN
INTRAMUSCULAR | Status: DC | PRN
  Administered 2015-01-17: 100 mg via INTRAVENOUS

## 2015-01-17 MED ORDER — LACTATED RINGERS IV SOLN
INTRAVENOUS | Status: DC | PRN
  Administered 2015-01-17: 20:00:00 via INTRAVENOUS

## 2015-01-17 MED ORDER — LORAZEPAM 1 MG PO TABS: 1.0000 mg | ORAL_TABLET | Freq: Once | ORAL | Status: DC

## 2015-01-17 MED ORDER — ONDANSETRON HCL 4 MG/2ML IJ SOLN: 4.0000 mg | Freq: Once | INTRAMUSCULAR | Status: DC | PRN

## 2015-01-17 MED ORDER — MIDAZOLAM HCL 2 MG/2ML IJ SOLN
INTRAMUSCULAR | Status: DC | PRN
Start: 2015-01-17 — End: 2015-01-17
  Administered 2015-01-17: 2 mg via INTRAVENOUS

## 2015-01-17 MED ORDER — FENTANYL CITRATE (PF) 100 MCG/2ML IJ SOLN: 25.0000 ug | INTRAMUSCULAR | Status: DC | PRN

## 2015-01-17 NOTE — Anesthesia Procedure Notes (Signed)
Procedure Name: Intubation Date/Time: 01/17/2015 8:23 PM Performed by: Waldo Laine Pre-anesthesia Checklist: Emergency Drugs available, Patient identified, Suction available, Patient being monitored and Timeout performed Oxygen Delivery Method: Circle system utilized Preoxygenation: Pre-oxygenation with 100% oxygen Intubation Type: IV induction, Cricoid Pressure applied and Rapid sequence Laryngoscope Size: Mac and 3 Grade View: Grade II Tube type: Oral Tube size: 7.5 mm Number of attempts: 1 Airway Equipment and Method: Stylet Placement Confirmation: ETT inserted through vocal cords under direct vision,  positive ETCO2 and breath sounds checked- equal and bilateral Secured at: 21 cm Tube secured with: Tape Dental Injury: Teeth and Oropharynx as per pre-operative assessment

## 2015-01-17 NOTE — ED Notes (Signed)
BEHAVIORAL HEALTH ROUNDING  Patient sleeping: No.  Patient alert and oriented: yes  Behavior appropriate: Yes. ; If no, describe:  Nutrition and fluids offered: Yes  Toileting and hygiene offered: Yes  Sitter present: not applicable  Law enforcement present: Yes O[employer]  

## 2015-01-17 NOTE — BHH Counselor (Signed)
Received phone call from [facility] ([phone]), stating they need updated Labs and vitals to ensure he is stable, since the removal of the razor. Information forwarded to patient nurse Dora Sims W.).

## 2015-01-17 NOTE — ED Notes (Signed)
BEHAVIORAL HEALTH ROUNDING Patient sleeping: No. Patient alert and oriented: yes Behavior appropriate: No ; If no, describe: cursing yelling because he was moved to hallway Nutrition and fluids offered: No NPO swallowed a spring earlier today per nursing report Toileting and hygiene offered: Yes  Sitter present: yes Law enforcement present: Yes

## 2015-01-17 NOTE — ED Notes (Addendum)
BEHAVIORAL HEALTH ROUNDING Patient sleeping: No. Patient alert and oriented: yes Behavior appropriate: Yes.  ; If no, describe:  Nutrition and fluids offered: Yes  Toileting and hygiene offered: Yes  Sitter present: yes. ED tech is present and makes every 15 minute checks. Law enforcement present: Yes. Officer in room, patient's left arm handcuffed to railing. Circulation and movement are WNL.

## 2015-01-17 NOTE — ED Provider Notes (Signed)
-----------------------------------------   5:48 PM on 01/17/2015 -----------------------------------------  I have discussed case with Dr. Toni Amend. The patient had made ongoing claims it he would hurt himself further if he was sent back to jail. He has a long history of self-harm. We are currently waiting for availability at [facility] or [facility].  I was informed approximately 10 minutes ago that the patient has just told the nurse that he found a metal spring in the bathroom today and he swallowed it. It is not clear that if this is true or what the size of the spring may be. I have ordered an x-ray of his chest and abdomen to assess for foreign body.  ----------------------------------------- 6:22 PM on 01/17/2015 -----------------------------------------  X-ray shows a coiled metallic radio opaque item consistent with a spring in the upper left abdomen.  I have called and spoken with Dr. Smith Mince, gastroenterology. He has viewed the X-Rays remotely. He will arrange for endoscopy to attempt removal of the foreign body.  ----------------------------------------- 8:01 PM on 01/17/2015 -----------------------------------------  I spoke with the patient. He is upset because he has not gotten his Wellbutrin. We are keeping him nothing by mouth prior to endoscopy. He has been moved to a hall bed and remains in handcuffs with a sheriff scarred by him. He is agitated because there is a screen blocking his view down the hall. The rest of the emergency department. He has asked for a screen to be removed. I have declined this request to ensure his privacy as well as the privacy of other people in the department.  ----------------------------------------- 11:25 PM on 01/17/2015 -----------------------------------------  Patient has been returned to the emergency department. He is alert and stable. We have given him his Wellbutrin which she is extremely focused on receiving. We offered him  Ativan as well which she declined.  Disposition to a psych facility is still pending.  Darien Ramus, MD 01/17/15 [phone]

## 2015-01-17 NOTE — ED Notes (Signed)
Consent signed to go to endoscopy pt transported by tech and officer accompanied.

## 2015-01-17 NOTE — Transfer of Care (Signed)
Immediate Anesthesia Transfer of Care Note  Patient: Vincent Cruz  Procedure(s) Performed: Procedure(s): FOREIGN BODY REMOVAL (N/A)  Patient Location: PACU  Anesthesia Type:General  Level of Consciousness: awake, alert , oriented and patient cooperative  Airway & Oxygen Therapy: Patient Spontanous Breathing and Patient connected to face mask oxygen  Post-op Assessment: Report given to RN and Post -op Vital signs reviewed and stable  Post vital signs: Reviewed and stable  Last Vitals:  Filed Vitals:   01/17/15 1431  BP: 110/74  Pulse: 75  Temp: 37.1 C  Resp: 16    Complications: No apparent anesthesia complications

## 2015-01-17 NOTE — ED Notes (Signed)
BEHAVIORAL HEALTH ROUNDING Patient sleeping: No. Patient alert and oriented: yes Behavior appropriate: Yes.  ; If no, describe:  Nutrition and fluids offered: Yes  Toileting and hygiene offered: Yes  Sitter present: yes Law enforcement present: Yes . Officer in the room. Left arm is handcuffed to the railing on the stretcher. Patient denies any discomfort with the handcuffs. Circulation and color are within normal limits.

## 2015-01-17 NOTE — ED Notes (Signed)
Patient states that at approximately 0900 this morning he found a spring in the quad bathroom and swallowed it. Patient states he thought swallowing the spring would get him to [facility] faster. Patient states his abdomen hurts when he eats.

## 2015-01-17 NOTE — BHH Counselor (Signed)
Received phone call from medical staff at the [facility] Department [phone]) requesting an update on the patient going to [facility]. Writer confirmed that she did indeed was from the [facility] Department and let her know, [facility] requested update lab work and at this point, their is no definite time frame of if and when he will moved to [facility].

## 2015-01-17 NOTE — ED Notes (Signed)
Pt moved to 1H yelling and hollering cursing at the officer that's at bedside.

## 2015-01-17 NOTE — Anesthesia Postprocedure Evaluation (Signed)
  Anesthesia Post-op Note  Patient: Vincent Cruz  Procedure(s) Performed: Procedure(s): FOREIGN BODY REMOVAL (N/A)  Anesthesia type:No value filed.  Patient location: PACU  Post pain: Pain level controlled  Post assessment: Post-op Vital signs reviewed, Patient's Cardiovascular Status Stable, Respiratory Function Stable, Patent Airway and No signs of Nausea or vomiting  Post vital signs: Reviewed and stable  Last Vitals:  Filed Vitals:   01/17/15 2133  BP: 142/85  Pulse: 76  Temp: 36.1 C  Resp: 18    Level of consciousness: awake, alert  and patient cooperative  Complications: No apparent anesthesia complications

## 2015-01-17 NOTE — Anesthesia Preprocedure Evaluation (Addendum)
Anesthesia Evaluation  Patient identified by MRN, date of birth, ID band Patient awake    Reviewed: Allergy & Precautions  Airway Mallampati: II       Dental   Pulmonary Current Smoker,           Cardiovascular negative cardio ROS       Neuro/Psych Depression    GI/Hepatic negative GI ROS, Neg liver ROS,   Endo/Other    Renal/GU negative Renal ROS     Musculoskeletal negative musculoskeletal ROS (+)   Abdominal   Peds  Hematology negative hematology ROS (+)   Anesthesia Other Findings   Reproductive/Obstetrics                            Anesthesia Physical Anesthesia Plan  ASA: III and emergent  Anesthesia Plan: General   Post-op Pain Management:    Induction: Intravenous  Airway Management Planned: Oral ETT  Additional Equipment:   Intra-op Plan:   Post-operative Plan: Extubation in OR  Informed Consent: I have reviewed the patients History and Physical, chart, labs and discussed the procedure including the risks, benefits and alternatives for the proposed anesthesia with the patient or authorized representative who has indicated his/her understanding and acceptance.     Plan Discussed with: CRNA  Anesthesia Plan Comments:         Anesthesia Quick Evaluation

## 2015-01-17 NOTE — ED Notes (Signed)
BEHAVIORAL HEALTH ROUNDING Patient sleeping: Yes.   Patient alert and oriented: not applicable SLEEPING Behavior appropriate: Yes.  ; If no, describe: SLEEPING Nutrition and fluids offered: No SLEEPING Toileting and hygiene offered: NoSLEEPING Sitter present: not applicable Law enforcement present: Yes ODS 

## 2015-01-17 NOTE — ED Notes (Signed)

## 2015-01-17 NOTE — ED Provider Notes (Signed)
-----------------------------------------   7:10 AM on 01/17/2015 -----------------------------------------   Blood pressure 118/69, pulse 76, temperature 98.2 F (36.8 C), temperature source Oral, resp. rate 18, height  (1.651 m), weight 130 lb (58.968 kg), SpO2 98 %.  The patient had no acute events since last update.  Calm and cooperative at this time.  Disposition is pending per Psychiatry/Behavioral Medicine team recommendations.     Rebecka Apley, MD 01/17/15 [phone]

## 2015-01-17 NOTE — ED Notes (Signed)
Pt resting in bed, officer at bedside

## 2015-01-17 NOTE — BHH Counselor (Signed)
Writer again, followed up with [facility] ([phone]) and was informed patient is still pending review with medical staff. Writer was advised, via Idelle Leech, not to call back but they([facility]) will call [facility], after he been reviewed.  Nothing else needed at this time.

## 2015-01-17 NOTE — BHH Counselor (Signed)
Updated Labs and Vitals faxed to [facility] ([phone]) and confirmed they were received.  They stated, they will be reviewed tomorrow(01/18/2015) by Dr. Adriana Simas. Writer called [phone] and left a voicemail message, requesting a return phone call, in order to update them about the patient.

## 2015-01-17 NOTE — ED Notes (Signed)
BEHAVIORAL HEALTH ROUNDING Patient sleeping: No. Patient alert and oriented: yes Behavior appropriate: Yes.  ;  Nutrition and fluids offered: Yes  Toileting and hygiene offered: Yes  Sitter present: yes Law enforcement present: Yes  

## 2015-01-17 NOTE — ED Notes (Signed)
BEHAVIORAL HEALTH ROUNDING Patient sleeping: No. Patient alert and oriented: yes Behavior appropriate: Yes.  ; If no, describe:  Nutrition and fluids offered: Yes  Toileting and hygiene offered: Yes  Sitter present: yes Law enforcement present: Yes  

## 2015-01-17 NOTE — OR Nursing (Signed)
Patient very uncooperative and wanting to go back to ER.  Patient had pulled his IV out on arrival to PACU.

## 2015-01-17 NOTE — ED Notes (Signed)
Pt ate 100% of breakfast tray.

## 2015-01-17 NOTE — BHH Counselor (Signed)
Followed up with [facility] Referral ([phone]). Patient referral is being reviewed by the medical department an Bubba Hales at this time. Writer was informed, [facility] will call back with what would take place next.

## 2015-01-17 NOTE — ED Notes (Signed)
BEHAVIORAL HEALTH ROUNDING Patient sleeping: No. Patient alert and oriented: yes Behavior appropriate: Yes.  ; If no, describe: Patient informed this writer that he swallowed a foreign body this morning at approximately 0900. Dr. Carollee Massed aware. Nutrition and fluids offered: Yes  Toileting and hygiene offered: Yes  Sitter present: yes. Officer at bedside. Patient's left wrist is handcuffed to railing. Circulation, color and movement are WNL. Law enforcement present: Yes
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
## 2015-01-17 NOTE — ED Notes (Signed)
When asked about SI/ HI pt denies SI at this time but states "If I go back to jail I will be back, I am [facility] to [facility] because there you can play ball, and walk around, and play cards"  Officer at bedside

## 2015-01-17 NOTE — ED Notes (Addendum)
BEHAVIORAL HEALTH ROUNDING Patient sleeping: No. Patient alert and oriented: yes Behavior appropriate: Yes.  ; If no, describe:  Nutrition and fluids offered: Yes  Toileting and hygiene offered: Yes  Sitter present: yes. ED tech present for q33min checks. Law enforcement present: Yes . Officer in room. Left arm handcuffed to railing. Circulation is good, color within normal limits.

## 2015-01-17 NOTE — ED Notes (Signed)

## 2015-01-18 MED ORDER — GI COCKTAIL ~~LOC~~
30.0000 mL | Freq: Once | ORAL | Status: AC
  Administered 2015-01-18: 30 mL via ORAL
  Filled 2015-01-18: qty 30

## 2015-01-18 MED ORDER — LORAZEPAM 1 MG PO TABS
1.0000 mg | ORAL_TABLET | Freq: Once | ORAL | Status: AC
  Administered 2015-01-18: 1 mg via ORAL

## 2015-01-18 NOTE — ED Notes (Signed)
Pt discharged back to jail with officers Cobb and Ryley after verbalizing understanding of discharge instructions; nad noted.

## 2015-01-18 NOTE — ED Notes (Signed)
BEHAVIORAL HEALTH ROUNDING Patient sleeping: No. Patient alert and oriented: yes Behavior appropriate: Yes.  ; If no, describe:  Nutrition and fluids offered: Yes  Toileting and hygiene offered: Yes  Sitter present: no Law enforcement present: Yes  

## 2015-01-18 NOTE — Op Note (Signed)
[facility] Gastroenterology Patient Name: Vincent Cruz Procedure Date: 01/17/2015 7:56 PM MRN: [id number] Account #: [id number] Date of Birth: Oct 28, 1965 Admit Type: Inpatient Age: 49 Room: [facility] ENDO ROOM 4 Gender: Male Note Status: Finalized Procedure:         Upper GI endoscopy Indications:       Foreign body in the stomach Providers:         Midge Minium, MD Medicines:         Monitored Anesthesia Care Complications:     No immediate complications. Procedure:         Pre-Anesthesia Assessment:                    - Prior to the procedure, a History and Physical was                     performed, and patient medications and allergies were                     reviewed. The patient's tolerance of previous anesthesia                     was also reviewed. The risks and benefits of the procedure                     and the sedation options and risks were discussed with the                     patient. All questions were answered, and informed consent                     was obtained. Prior Anticoagulants: The patient has taken                     no previous anticoagulant or antiplatelet agents. ASA                     Grade Assessment: II - A patient with mild systemic                     disease. After reviewing the risks and benefits, the                     patient was deemed in satisfactory condition to undergo                     the procedure.                    After obtaining informed consent, the endoscope was passed                     under direct vision. Throughout the procedure, the                     patient's blood pressure, pulse, and oxygen saturations                     were monitored continuously. The Endoscope was introduced                     through the mouth, and advanced to the second part of                     duodenum. The upper GI  endoscopy was accomplished without                     difficulty. The patient tolerated the procedure  well. Findings:      The examined esophagus was normal.      Ingested spring was found in the gastric fundus. Removal of Spring was       accomplished with a basket.      The examined duodenum was normal. Impression:        - Normal esophagus.                    - Were found in the stomach. Removal was successful.                    - Normal examined duodenum. Recommendation:    - Observe patient's clinical course. Procedure Code(s): --- Professional ---                    [phone], Esophagogastroduodenoscopy, flexible, transoral;                     with removal of foreign body(s) Diagnosis Code(s): --- Professional ---                    X91.4NWG, Foreign body in stomach, initial encounter CPT copyright 2014 American Medical Association. All rights reserved. The codes documented in this report are preliminary and upon coder review may  be revised to meet current compliance requirements. Midge Minium, MD 01/17/2015 8:53:49 PM This report has been signed electronically. Number of Addenda: 0 Note Initiated On: 01/17/2015 7:56 PM      [facility]

## 2015-01-18 NOTE — ED Notes (Signed)
BEHAVIORAL HEALTH ROUNDING Patient sleeping: No. Patient alert and oriented: yes Behavior appropriate: Yes.  ; If no, describe:  Nutrition and fluids offered: Yes  Toileting and hygiene offered: Yes  Sitter present: yes Law enforcement present: Yes  

## 2015-01-18 NOTE — ED Notes (Signed)
BEHAVIORAL HEALTH ROUNDING Patient sleeping: Yes.   Patient alert and oriented: yes Behavior appropriate: Yes.  ; If no, describe:  Nutrition and fluids offered: sleeping Toileting and hygiene offered: sleeping Sitter present: yes Law enforcement present: Yes  Pt is sleeping at this time with officer at the bedside

## 2015-01-18 NOTE — ED Provider Notes (Addendum)
-----------------------------------------   9:01 AM on 01/18/2015 -----------------------------------------   Blood pressure 142/85, pulse 76, temperature 97 F (36.1 C), temperature source Oral, resp. rate 18, height  (1.651 m), weight 130 lb (58.968 kg), SpO2 98 %.  The patient had no acute events since last update.  Calm and cooperative at this time.  Disposition is pending per Psychiatry/Behavioral Medicine team recommendations.     Arnaldo Natal, MD 01/18/15 [phone]  Patient seen by Dr. Mat Carne packs who feels the patient is not suicidal. He rescinded commitment papers. Patient was transferred back to the custody of the jail. [facility] will be [facility].  Arnaldo Natal, MD 01/18/15 [phone]

## 2015-01-18 NOTE — ED Notes (Signed)
[place] [facility] [place] [facility] I am to call [facility] with VS and meds for this pt in case he has to go there, although it is probable that he will stay local. Call [phone], and return to her with accepting physician.

## 2015-01-18 NOTE — ED Notes (Signed)
Pharmacy contacted for missing wellbutrin dose

## 2015-01-18 NOTE — ED Notes (Signed)
Resumed care from stephen. Pt sleeping soundly.

## 2015-01-18 NOTE — BHH Counselor (Signed)
Writer receive phone call from [facility] ([place]-[phone]) stating the patient is able to transport to their facility. They requested to talk with Patient Nurse Floyce Stakes.).  Writer updated patient nurse Floyce Stakes.) & ER MD (Dr. Darnelle Catalan).  Writer called and followed up with [facility] Department (Susan-[phone]) and she confirmed they had already taking care of everything for the pateint to be transfer to [facility].  She also stated another officer Toma Aran) was on the way to [facility] to transport the patient there. Writer updated the ER MD(Dr. Darnelle Catalan) of this as well.
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
## 2015-01-18 NOTE — ED Notes (Signed)
Pt given cup of coffee.

## 2015-01-18 NOTE — BHH Counselor (Signed)
09:20--Received from [facility] (Connie-[phone]) and she stated the patient was reviewed and declined by their Medical Director (Dr. Adriana Simas), "He is doing what inmates do." Writer was advised, to have patient return to back to the jail. She also stated, If the jail have concerns about his safety, they will need to work on having him being transferred to [facility] for safe keeping.   09:47-- Writer updated ER MD (Dr. Juliette Alcide), Psych MD (Dr. Toni Amend) & ER Medical Director (Dr. Cyril Loosen).  09:50--Writer called [facility] Department ([phone]) and updated them as well. She stated she was going to contact the [facility].

## 2015-03-08 ENCOUNTER — Emergency Department: Payer: Medicaid Other | Admitting: Anesthesiology

## 2015-03-08 ENCOUNTER — Encounter: Payer: Self-pay | Admitting: Emergency Medicine

## 2015-03-08 ENCOUNTER — Ambulatory Visit: Admit: 2015-03-08 | Payer: Self-pay | Admitting: Gastroenterology

## 2015-03-08 ENCOUNTER — Emergency Department: Payer: Medicaid Other

## 2015-03-08 ENCOUNTER — Observation Stay
Admission: EM | Admit: 2015-03-08 | Discharge: 2015-03-10 | Discharge status: Against Medical Advice | Payer: Medicaid Other | Attending: Internal Medicine | Admitting: Internal Medicine

## 2015-03-08 ENCOUNTER — Encounter
Admission: EM | Discharge status: Against Medical Advice | Payer: Self-pay | Source: Home / Self Care | Attending: Emergency Medicine

## 2015-03-08 DIAGNOSIS — T148 Other injury of unspecified body region: Secondary | ICD-10-CM | POA: Diagnosis present

## 2015-03-08 DIAGNOSIS — F1210 Cannabis abuse, uncomplicated: Secondary | ICD-10-CM | POA: Insufficient documentation

## 2015-03-08 DIAGNOSIS — I959 Hypotension, unspecified: Secondary | ICD-10-CM | POA: Insufficient documentation

## 2015-03-08 DIAGNOSIS — F17210 Nicotine dependence, cigarettes, uncomplicated: Secondary | ICD-10-CM | POA: Diagnosis not present

## 2015-03-08 DIAGNOSIS — J449 Chronic obstructive pulmonary disease, unspecified: Secondary | ICD-10-CM | POA: Diagnosis not present

## 2015-03-08 DIAGNOSIS — X58XXXA Exposure to other specified factors, initial encounter: Secondary | ICD-10-CM | POA: Insufficient documentation

## 2015-03-08 DIAGNOSIS — IMO0002 Reserved for concepts with insufficient information to code with codable children: Secondary | ICD-10-CM

## 2015-03-08 DIAGNOSIS — T189XXA Foreign body of alimentary tract, part unspecified, initial encounter: Secondary | ICD-10-CM

## 2015-03-08 DIAGNOSIS — R0902 Hypoxemia: Secondary | ICD-10-CM | POA: Diagnosis not present

## 2015-03-08 DIAGNOSIS — Z888 Allergy status to other drugs, medicaments and biological substances status: Secondary | ICD-10-CM | POA: Insufficient documentation

## 2015-03-08 DIAGNOSIS — F329 Major depressive disorder, single episode, unspecified: Secondary | ICD-10-CM | POA: Insufficient documentation

## 2015-03-08 DIAGNOSIS — Z7289 Other problems related to lifestyle: Secondary | ICD-10-CM | POA: Diagnosis present

## 2015-03-08 DIAGNOSIS — Z79899 Other long term (current) drug therapy: Secondary | ICD-10-CM | POA: Insufficient documentation

## 2015-03-08 DIAGNOSIS — R1013 Epigastric pain: Secondary | ICD-10-CM | POA: Insufficient documentation

## 2015-03-08 DIAGNOSIS — Z915 Personal history of self-harm: Secondary | ICD-10-CM | POA: Insufficient documentation

## 2015-03-08 DIAGNOSIS — Z9889 Other specified postprocedural states: Secondary | ICD-10-CM | POA: Insufficient documentation

## 2015-03-08 DIAGNOSIS — T182XXA Foreign body in stomach, initial encounter: Secondary | ICD-10-CM | POA: Diagnosis not present

## 2015-03-08 DIAGNOSIS — J45909 Unspecified asthma, uncomplicated: Secondary | ICD-10-CM | POA: Diagnosis not present

## 2015-03-08 DIAGNOSIS — R011 Cardiac murmur, unspecified: Secondary | ICD-10-CM | POA: Diagnosis not present

## 2015-03-08 DIAGNOSIS — F603 Borderline personality disorder: Secondary | ICD-10-CM | POA: Insufficient documentation

## 2015-03-08 DIAGNOSIS — M1990 Unspecified osteoarthritis, unspecified site: Secondary | ICD-10-CM | POA: Insufficient documentation

## 2015-03-08 DIAGNOSIS — T185XXA Foreign body in anus and rectum, initial encounter: Secondary | ICD-10-CM

## 2015-03-08 HISTORY — PX: ESOPHAGOGASTRODUODENOSCOPY: SHX5428

## 2015-03-08 HISTORY — DX: Cardiac murmur, unspecified: R01.1

## 2015-03-08 HISTORY — DX: Depression, unspecified: F32.A

## 2015-03-08 HISTORY — DX: Major depressive disorder, single episode, unspecified: F32.9

## 2015-03-08 HISTORY — DX: Unspecified osteoarthritis, unspecified site: M19.90

## 2015-03-08 HISTORY — DX: Unspecified asthma, uncomplicated: J45.909

## 2015-03-08 LAB — CBC
HCT: 41.4 % (ref 40.0–52.0)
HEMOGLOBIN: 14.2 g/dL (ref 13.0–18.0)
MCH: 30.3 pg (ref 26.0–34.0)
MCHC: 34.2 g/dL (ref 32.0–36.0)
MCV: 88.6 fL (ref 80.0–100.0)
PLATELETS: 284 10*3/uL (ref 150–440)
RBC: 4.67 MIL/uL (ref 4.40–5.90)
RDW: 17.5 % — ABNORMAL HIGH (ref 11.5–14.5)
WBC: 5.4 10*3/uL (ref 3.8–10.6)

## 2015-03-08 LAB — COMPREHENSIVE METABOLIC PANEL
ALT: 16 U/L — AB (ref 17–63)
ANION GAP: 7 (ref 5–15)
AST: 20 U/L (ref 15–41)
Albumin: 4.5 g/dL (ref 3.5–5.0)
Alkaline Phosphatase: 63 U/L (ref 38–126)
BUN: 13 mg/dL (ref 6–20)
CHLORIDE: 106 mmol/L (ref 101–111)
CO2: 26 mmol/L (ref 22–32)
Calcium: 9.7 mg/dL (ref 8.9–10.3)
Creatinine, Ser: 0.99 mg/dL (ref 0.61–1.24)
Glucose, Bld: 94 mg/dL (ref 65–99)
POTASSIUM: 4.2 mmol/L (ref 3.5–5.1)
SODIUM: 139 mmol/L (ref 135–145)
Total Bilirubin: 0.4 mg/dL (ref 0.3–1.2)
Total Protein: 7.4 g/dL (ref 6.5–8.1)

## 2015-03-08 LAB — PROTIME-INR
INR: 0.98
PROTHROMBIN TIME: 13.2 s (ref 11.4–15.0)

## 2015-03-08 LAB — APTT: APTT: 30 s (ref 24–36)

## 2015-03-08 SURGERY — EGD (ESOPHAGOGASTRODUODENOSCOPY)
Anesthesia: General

## 2015-03-08 MED ORDER — OXYCODONE-ACETAMINOPHEN 5-325 MG PO TABS
1.0000 | ORAL_TABLET | Freq: Four times a day (QID) | ORAL | Status: DC | PRN
  Administered 2015-03-08 – 2015-03-09 (×3): 1 via ORAL
  Filled 2015-03-08 (×3): qty 1

## 2015-03-08 MED ORDER — MIDAZOLAM HCL 2 MG/2ML IJ SOLN
INTRAMUSCULAR | Status: DC | PRN
  Administered 2015-03-08: 2 mg via INTRAVENOUS

## 2015-03-08 MED ORDER — SUCCINYLCHOLINE CHLORIDE 20 MG/ML IJ SOLN
INTRAMUSCULAR | Status: DC | PRN
  Administered 2015-03-08: 120 mg via INTRAVENOUS

## 2015-03-08 MED ORDER — ONDANSETRON HCL 4 MG/2ML IJ SOLN: 4.0000 mg | Freq: Once | INTRAMUSCULAR | Status: DC | PRN

## 2015-03-08 MED ORDER — PROPOFOL 10 MG/ML IV BOLUS
INTRAVENOUS | Status: DC | PRN
  Administered 2015-03-08: 200 mg via INTRAVENOUS

## 2015-03-08 MED ORDER — SODIUM CHLORIDE 0.9 % IV SOLN
INTRAVENOUS | Status: DC
  Administered 2015-03-08: 18:00:00 via INTRAVENOUS

## 2015-03-08 MED ORDER — SEVOFLURANE IN SOLN: RESPIRATORY_TRACT | Status: DC | PRN

## 2015-03-08 MED ORDER — FENTANYL CITRATE (PF) 100 MCG/2ML IJ SOLN: 25.0000 ug | INTRAMUSCULAR | Status: DC | PRN

## 2015-03-08 MED ORDER — FENTANYL CITRATE (PF) 100 MCG/2ML IJ SOLN
INTRAMUSCULAR | Status: DC | PRN
  Administered 2015-03-08: 100 ug via INTRAVENOUS

## 2015-03-08 MED ORDER — BUPROPION HCL ER (SR) 100 MG PO TB12
200.0000 mg | ORAL_TABLET | Freq: Two times a day (BID) | ORAL | Status: DC
Start: 2015-03-08 — End: 2015-03-10
  Administered 2015-03-08 – 2015-03-09 (×3): 200 mg via ORAL
  Filled 2015-03-08 (×3): qty 2

## 2015-03-08 MED ORDER — GLYCOPYRROLATE 0.2 MG/ML IJ SOLN
INTRAMUSCULAR | Status: DC | PRN
  Administered 2015-03-08: 0.2 mg via INTRAVENOUS

## 2015-03-08 MED ORDER — DIPHENHYDRAMINE HCL 50 MG/ML IJ SOLN
INTRAMUSCULAR | Status: DC | PRN
  Administered 2015-03-08: 25 mg via INTRAVENOUS

## 2015-03-08 NOTE — Transfer of Care (Signed)
Immediate Anesthesia Transfer of Care Note  Patient: Vincent Cruz  Procedure(s) Performed: Procedure(s): ESOPHAGOGASTRODUODENOSCOPY (EGD) (N/A)  Patient Location: PACU  Anesthesia Type:General  Level of Consciousness: sedated  Airway & Oxygen Therapy: Patient Spontanous Breathing and Patient connected to face mask oxygen  Post-op Assessment: Report given to RN  Post vital signs: Reviewed  Last Vitals:  Filed Vitals:   03/08/15 1819  BP: 96/72  Pulse: 77  Temp: 36.4 C  Resp: 18    Complications: No apparent anesthesia complications

## 2015-03-08 NOTE — Anesthesia Preprocedure Evaluation (Addendum)
Anesthesia Evaluation  Patient identified by MRN, date of birth, ID band Patient awake    Reviewed: Allergy & Precautions, NPO status , Patient's Chart, lab work & pertinent test results, reviewed documented beta blocker date and time   Airway Mallampati: II  TM Distance: >3 FB     Dental  (+) Chipped   Pulmonary asthma , Current Smoker,           Cardiovascular + Valvular Problems/Murmurs      Neuro/Psych PSYCHIATRIC DISORDERS Depression    GI/Hepatic   Endo/Other    Renal/GU      Musculoskeletal  (+) Arthritis ,   Abdominal   Peds  Hematology   Anesthesia Other Findings Pt ate at 12 pm. This is an emergency becaause the blade could pass into the intestine. Will do a RS intubation. He states he may had a mild rash with propofol. I wwill have benadryl available is needed.  Reproductive/Obstetrics                           Anesthesia Physical Anesthesia Plan  ASA: II  Anesthesia Plan: General   Post-op Pain Management:    Induction: Intravenous  Airway Management Planned: Oral ETT  Additional Equipment:   Intra-op Plan:   Post-operative Plan:   Informed Consent: I have reviewed the patients History and Physical, chart, labs and discussed the procedure including the risks, benefits and alternatives for the proposed anesthesia with the patient or authorized representative who has indicated his/her understanding and acceptance.     Plan Discussed with: CRNA  Anesthesia Plan Comments:         Anesthesia Quick Evaluation

## 2015-03-08 NOTE — H&P (Signed)
  Primary Care Physician:  No primary care provider on file.  Pre-Procedure History & Physical: HPI:  Vincent Cruz is a 49 y.o. male is here for an endoscopy.   Past Medical History  Diagnosis Date  . Self mutilating behavior   . Asthma   . Heart murmur   . Depression   . Arthritis     Past Surgical History  Procedure Laterality Date  . Abdominal surgery    . Hernia repair    . Fracture surgery      Prior to Admission medications   Medication Sig Start Date End Date Taking? Authorizing Provider  buPROPion (WELLBUTRIN SR) 200 MG 12 hr tablet Take 200 mg by mouth 2 (two) times daily.   Yes Historical Provider, MD    Allergies as of 03/08/2015 - Review Complete 03/08/2015  Allergen Reaction Noted  . Propofol Other (See Comments) 01/12/2015    History reviewed. No pertinent family history.  Social History   Social History  . Marital Status: Unknown    Spouse Name: N/A  . Number of Children: N/A  . Years of Education: N/A   Occupational History  . Not on file.   Social History Main Topics  . Smoking status: Current Every Day Smoker    Types: Cigarettes  . Smokeless tobacco: Not on file  . Alcohol Use: No  . Drug Use: Not on file  . Sexual Activity: Not on file   Other Topics Concern  . Not on file   Social History Narrative     Physical Exam: BP 106/72 mmHg  Pulse 79  Temp(Src) 98.1 F (36.7 C) (Oral)  Resp 16  Ht 5\' 5"  (1.651 m)  Wt 58.968 kg (130 lb)  BMI 21.63 kg/m2  SpO2 97% General:   A and O x 4 Head:  Normocephalic and atraumatic. Neck:  Supple; no masses or thyromegaly. Lungs:  Clear throughout to auscultation.    Heart:  Regular rate and rhythm. Abdomen:  Soft, nontender and nondistended. Normal bowel sounds, without guarding, and without rebound.   Neurologic:  Alert and  oriented x4;  grossly normal neurologically.  Impression/Plan: Vincent Dickaul Archambeault is here for an endoscopy to be performed for foreign body removal ( razor blade in LUQ on  x-ray, likely in stomach)  Risks, benefits, limitations, and alternatives regarding  endoscopy have been reviewed with the patient.  Questions have been answered.  All parties agreeable.   Elnita MaxwellEIN, Timesha Cervantez GORDON, MD  03/08/2015, 5:36 PM

## 2015-03-08 NOTE — Discharge Instructions (Signed)

## 2015-03-08 NOTE — Anesthesia Procedure Notes (Signed)
Procedure Name: Intubation Performed by: Mathews ArgyleLOGAN, Kellina Dreese Pre-anesthesia Checklist: Patient identified, Patient being monitored, Timeout performed, Emergency Drugs available and Suction available Patient Re-evaluated:Patient Re-evaluated prior to inductionOxygen Delivery Method: Circle system utilized Preoxygenation: Pre-oxygenation with 100% oxygen Intubation Type: IV induction, Rapid sequence and Cricoid Pressure applied Laryngoscope Size: 3 and Miller Grade View: Grade I Tube type: Oral Tube size: 7.0 mm Number of attempts: 1 Airway Equipment and Method: Stylet Placement Confirmation: ETT inserted through vocal cords under direct vision,  positive ETCO2 and breath sounds checked- equal and bilateral Secured at: 21 cm Tube secured with: Tape Dental Injury: Teeth and Oropharynx as per pre-operative assessment

## 2015-03-08 NOTE — Op Note (Signed)
[facility] Gastroenterology Patient Name: Vincent Cruz Procedure Date: 03/08/2015 5:37 PM MRN: [id number] Account #: [id number] Date of Birth: 17-Mar-1966 Admit Type: Outpatient Age: 49 Room: [facility] ENDO ROOM 3 Gender: Male Note Status: Finalized Procedure:         Upper GI endoscopy Indications:       Foreign body in the stomach (metallic foreigh body LUQ,                     reports swallowing razor blade) Patient Profile:   This is a 49 year old male. Providers:         Rhona Raider. Shelle Iron, MD Referring MD:      Sallye Lat Md, MD (Referring MD) Medicines:         General Anesthesia Complications:     No immediate complications. Procedure:         Pre-Anesthesia Assessment:                    - Prior to the procedure, a History and Physical was                     performed, and patient medications, allergies and                     sensitivities were reviewed. The patient's tolerance of                     previous anesthesia was reviewed.                    After obtaining informed consent, the endoscope was passed                     under direct vision. Throughout the procedure, the                     patient's blood pressure, pulse, and oxygen saturations                     were monitored continuously. The Endoscope was introduced                     through the mouth, and advanced to the second part of                     duodenum. The upper GI endoscopy was accomplished without                     difficulty. The patient tolerated the procedure well. Findings:      The esophagus was normal.      The stomach was normal.      The examined duodenum was normal.      - No foreign body found. Impression:        - Normal esophagus.                    - Normal stomach.                    - Normal examined duodenum.                    - No specimens collected.                    - No foreign body found Recommendation:    -  Observe patient in GI recovery unit.               - Continue present medications.                    - Observe patient's clinical course.                    - Obtain KUB now to determine if metalic foreign body is                     still present.                    - Will require q12 KUB to follow progress of foreign body                     until passes.                    - Since this cannot be done at prison reliably, will                     require admission to hospital.                    - Discussed plan with surgeon on call. Procedure Code(s): --- Professional ---                    [id number], Esophagogastroduodenoscopy, flexible, transoral;                     diagnostic, including collection of specimen(s) by                     brushing or washing, when performed (separate procedure) CPT copyright 2014 American Medical Association. All rights reserved. The codes documented in this report are preliminary and upon coder review may  be revised to meet current compliance requirements. Kathalene FramesMatthew G Rein, MD 03/08/2015 6:23:55 PM This report has been signed electronically. Number of Addenda: 0 Note Initiated On: 03/08/2015 5:37 PM      [facility]

## 2015-03-08 NOTE — ED Provider Notes (Addendum)
[facility] Emergency Department Provider Note     Time seen: ----------------------------------------- 3:28 PM on 03/08/2015 -----------------------------------------    I have reviewed the triage vital signs and the nursing notes.   HISTORY  Chief Complaint Laceration and Swallowed Foreign Body    HPI Vincent Cruz is a 49 y.o. male who presents ER after he cut his right arm with a razor blade and then swallowed a razor blade due to not being able to go home. Patient is currently incarcerated, states he was trying to hurt himself. Patient has a history of same, has had ingestion of part of a razor blade before. Patient states he swallowed part of a shaving razor blade. He is noted to have a history of self mutilating behavior.Patient currently denies complaints.   Past Medical History  Diagnosis Date  . Self mutilating behavior   . Asthma     Patient Active Problem List   Diagnosis Date Noted  . Borderline personality disorder 01/17/2015  . Cannabis abuse 01/17/2015  . Foreign body alimentary tract 01/17/2015  . 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 Foreign body in stomach   . Foreign body ingestion     Past Surgical History  Procedure Laterality Date  . Abdominal surgery      Allergies Propofol  Social History Social History  Substance Use Topics  . Smoking status: Current Every Day Smoker  . Smokeless tobacco: None  . Alcohol Use: No    Review of Systems Constitutional: Negative for fever. Eyes: Negative for visual changes. ENT: Negative for sore throat. Cardiovascular: Negative for chest pain. Respiratory: Negative for shortness of breath. Gastrointestinal: Negative for abdominal pain, vomiting and diarrhea. Positive for swallowed foreign body Genitourinary: Negative for dysuria. Musculoskeletal: Negative for back pain. Skin: Positive for right upper extremity laceration Neurological: Negative for headaches, focal weakness or  numbness.  10-point ROS otherwise negative.  ____________________________________________   PHYSICAL EXAM:  VITAL SIGNS: ED Triage Vitals  Enc Vitals Group     BP 03/08/15 1451 126/87 mmHg     Pulse Rate 03/08/15 1451 79     Resp 03/08/15 1451 16     Temp 03/08/15 1451 98.1 F (36.7 C)     Temp Source 03/08/15 1451 Oral     SpO2 03/08/15 1451 97 %     Weight 03/08/15 1451 130 lb (58.968 kg)     Height 03/08/15 1451 5\' 5"  (1.651 m)     Head Cir --      Peak Flow --      Pain Score 03/08/15 1451 0     Pain Loc --      Pain Edu? --      Excl. in GC? --     Constitutional: Alert and oriented. Well appearing and in no distress. Eyes: Conjunctivae are normal. PERRL. Normal extraocular movements. ENT   Head: Normocephalic and atraumatic.   Nose: No congestion/rhinnorhea.   Mouth/Throat: Mucous membranes are moist.   Neck: No stridor. Cardiovascular: Normal rate, regular rhythm. Normal and symmetric distal pulses are present in all extremities. No murmurs, rubs, or gallops. Respiratory: Normal respiratory effort without tachypnea nor retractions. Breath sounds are clear and equal bilaterally. No wheezes/rales/rhonchi. Gastrointestinal: Soft and nontender. No distention. No abdominal bruits.  Musculoskeletal: Right upper extremity laceration without significant bleeding. There are numerous old laceration marks on bilateral upper extremities. Neurologic:  Normal speech and language. No gross focal neurologic deficits are appreciated. Speech is normal. No gait instability. Skin:  5 cm laceration is noted  over the distal forearm dorsally. Laceration is linear. Psychiatric: Bizarre mood and affect, patient does exhibit appropriate insight. ____________________________________________  ED COURSE:  Pertinent labs & imaging results that were available during my care of the patient were reviewed by me and considered in my medical decision making (see chart for  details). We'll obtain x-rays to review the foreign body. Likely patient will need endoscopy and foreign body removal. Patient also need laceration repair ____________________________________________   LACERATION REPAIR Performed by: Emily Filbert Authorized by: Daryel November E Consent: Verbal consent obtained. Risks and benefits: risks, benefits and alternatives were discussed Consent given by: patient Patient identity confirmed: provided demographic data Prepped and Draped in normal sterile fashion Wound explored  Laceration Location: Right forearm  Laceration Length: 5 cm  No Foreign Bodies seen or palpated  Irrigation method: Saline soaked gauze  Amount of cleaning: standard  Skin closure: Dermabond   Technique: Standard application   Patient tolerance: Patient tolerated the procedure well with no immediate complications.   LABS (pertinent positives/negatives)  Labs Reviewed  CBC  COMPREHENSIVE METABOLIC PANEL  APTT  PROTIME-INR    RADIOLOGY Images were viewed by me  Acute abdominal series Metallic foreign body seen likely in the stomach IMPRESSION: Radiopaque foreign body noted in the region of the stomach and left upper quadrant.  Normal chest x-ray.  No findings for bowel obstruction or free air. ____________________________________________  FINAL ASSESSMENT AND PLAN  Ingested foreign body, 5 cm forearm laceration  Plan: Patient with labs and imaging as dictated above. Patient is in no acute distress, is asymptomatic at this time. Will discuss with GI to assess the need for endoscopy.   Emily Filbert, MD   Emily Filbert, MD 03/08/15 1604  Emily Filbert, MD 03/08/15 [phone]

## 2015-03-08 NOTE — Anesthesia Postprocedure Evaluation (Signed)
  Anesthesia Post-op Note  Patient: Vincent Cruz  Procedure(s) Performed: Procedure(s): ESOPHAGOGASTRODUODENOSCOPY (EGD) (N/A)  Anesthesia type:General  Patient location: PACU  Post pain: Pain level controlled  Post assessment: Post-op Vital signs reviewed, Patient's Cardiovascular Status Stable, Respiratory Function Stable, Patent Airway and No signs of Nausea or vomiting  Post vital signs: Reviewed and stable  Last Vitals:  Filed Vitals:   03/08/15 1842  BP: 99/71  Pulse: 73  Temp:   Resp: 16    Level of consciousness: awake, alert  and patient cooperative  Complications: No apparent anesthesia complications

## 2015-03-08 NOTE — ED Notes (Signed)
[facility]. Pt with lac to right arm from razor blade and then swallowed the razor blade due to not being able to go home. Pt denies wanting to kill himself, reports hwe was trying to hurt himself "to make them work" (in reference to the officers).

## 2015-03-08 NOTE — H&P (Signed)
[facility] [place] [facility]   PATIENT NAME: Vincent Cruz    MR#:  [id number]  DATE OF BIRTH:  04/07/1966  DATE OF ADMISSION:  03/08/2015  PRIMARY CARE PHYSICIAN: No primary care provider on file.   REQUESTING/REFERRING PHYSICIAN: Alcus DadMathew Rien  CHIEF COMPLAINT:   Chief Complaint  Patient presents with  . Laceration  . Swallowed Foreign Body    HISTORY OF PRESENT ILLNESS: Vincent Cruz  is a 49 y.o. male with a known history of depression, in jail- swallowed razor blade - so brought here. EGD done- nothing in stomach. Lateral view confirmed it being in colon- so suggested to just observe and repeat xray 12 hourly. I spoke to surgery- as now FB in colon- we can start feeding.  PAST MEDICAL HISTORY:   Past Medical History  Diagnosis Date  . Self mutilating behavior   . Asthma   . Heart murmur   . Depression   . Arthritis     PAST SURGICAL HISTORY:  Past Surgical History  Procedure Laterality Date  . Abdominal surgery    . Hernia repair    . Fracture surgery      SOCIAL HISTORY:  Social History  Substance Use Topics  . Smoking status: Current Every Day Smoker    Types: Cigarettes  . Smokeless tobacco: Not on file  . Alcohol Use: No    FAMILY HISTORY: History reviewed. No pertinent family history.  DRUG ALLERGIES:  Allergies  Allergen Reactions  . Propofol Other (See Comments)    Reaction:  Unknown     REVIEW OF SYSTEMS:   CONSTITUTIONAL: No fever, fatigue or weakness.  EYES: No blurred or double vision.  EARS, NOSE, AND THROAT: No tinnitus or ear pain.  RESPIRATORY: No cough, shortness of breath, wheezing or hemoptysis.  CARDIOVASCULAR: No chest pain, orthopnea, edema.  GASTROINTESTINAL: No nausea, vomiting, diarrhea , mild abdominal pain.  GENITOURINARY: No dysuria, hematuria.  ENDOCRINE: No polyuria, nocturia,  HEMATOLOGY: No anemia, easy bruising or bleeding SKIN: No rash or lesion. MUSCULOSKELETAL: No joint pain or  arthritis.   NEUROLOGIC: No tingling, numbness, weakness.  PSYCHIATRY: No anxiety or depression.   MEDICATIONS AT HOME:  Prior to Admission medications   Medication Sig Start Date End Date Taking? Authorizing Provider  buPROPion (WELLBUTRIN SR) 200 MG 12 hr tablet Take 200 mg by mouth 2 (two) times daily.   Yes Historical Provider, MD      PHYSICAL EXAMINATION:   VITAL SIGNS: Blood pressure 100/64, pulse 78, temperature 97.9 F (36.6 C), temperature source Oral, resp. rate 20, height 5\' 5"  (1.651 m), weight 58.968 kg (130 lb), SpO2 91 %.  GENERAL:  49 y.o.-year-old patient lying in the bed with no acute distress.  EYES: Pupils equal, round, reactive to light and accommodation. No scleral icterus. Extraocular muscles intact.  HEENT: Head atraumatic, normocephalic. Oropharynx and nasopharynx clear.  NECK:  Supple, no jugular venous distention. No thyroid enlargement, no tenderness.  LUNGS: Normal breath sounds bilaterally, no wheezing, rales,rhonchi or crepitation. No use of accessory muscles of respiration.  CARDIOVASCULAR: S1, S2 normal. No murmurs, rubs, or gallops.  ABDOMEN: Soft, nontender, nondistended. Bowel sounds present. No organomegaly or mass.  EXTREMITIES: No pedal edema, cyanosis, or clubbing.  NEUROLOGIC: Cranial nerves II through XII are intact. Muscle strength 5/5 in all extremities. Sensation intact. Gait not checked.  PSYCHIATRIC: The patient is alert and oriented x 3.  SKIN: No obvious rash, lesion, or ulcer.   LABORATORY PANEL:   CBC  Recent Labs Lab 03/08/15 1634  WBC 5.4  HGB 14.2  HCT 41.4  PLT 284  MCV 88.6  MCH 30.3  MCHC 34.2  RDW 17.5*   ------------------------------------------------------------------------------------------------------------------  Chemistries   Recent Labs Lab 03/08/15 1634  NA 139  K 4.2  CL 106  CO2 26  GLUCOSE 94  BUN 13  CREATININE 0.99  CALCIUM 9.7  AST 20  ALT 16*  ALKPHOS 63  BILITOT 0.4    ------------------------------------------------------------------------------------------------------------------ estimated creatinine clearance is 74.5 mL/min (by C-G formula based on Cr of 0.99). ------------------------------------------------------------------------------------------------------------------ No results for input(s): TSH, T4TOTAL, T3FREE, THYROIDAB in the last 72 hours.  Invalid input(s): FREET3   Coagulation profile  Recent Labs Lab 03/08/15 1634  INR 0.98   ------------------------------------------------------------------------------------------------------------------- No results for input(s): DDIMER in the last 72 hours. -------------------------------------------------------------------------------------------------------------------  Cardiac Enzymes No results for input(s): CKMB, TROPONINI, MYOGLOBIN in the last 168 hours.  Invalid input(s): CK ------------------------------------------------------------------------------------------------------------------ Invalid input(s): POCBNP  ---------------------------------------------------------------------------------------------------------------  Urinalysis No results found for: COLORURINE, APPEARANCEUR, LABSPEC, PHURINE, GLUCOSEU, HGBUR, BILIRUBINUR, KETONESUR, PROTEINUR, UROBILINOGEN, NITRITE, LEUKOCYTESUR   RADIOLOGY: Dg Abd 1 View  03/08/2015  CLINICAL DATA:  Radiopaque foreign body EXAM: ABDOMEN - 1 VIEW COMPARISON:  Frontal view of abdomen obtained earlier in the day FINDINGS: Cross-table lateral view obtained. The radiopaque foreign body in is less well seen on the lateral view. It appears to be located anteriorly, likely in the colon near the splenic flexure. No free air. Bowel gas pattern normal. No bony abnormality. IMPRESSION: Radiopaque foreign body appears anterior in location on lateral view, likely in colon. No free air apparent. Electronically Signed   By: Bretta Bang III M.D.   On:  03/08/2015 19:37   Dg Abd 1 View  03/08/2015  CLINICAL DATA:  Patient had endoscopy to remove foreign body, razor blade, unable to locate. EXAM: ABDOMEN - 1 VIEW COMPARISON:  Chest and abdomen 03/08/2015 FINDINGS: Radiopaque foreign body again demonstrated projected over the left upper quadrant. Location is similar to the previous study. An dislocation, the structure could be in the stomach, left upper quadrant small bowel, or splenic flexure of the colon. An extrinsic superimposed structure is also not excluded on this single view study. Surgical clips in the left upper quadrant. Stool-filled colon. No small or large bowel distention. No radiopaque stones. Visualized bones appear intact with mild degenerative changes. IMPRESSION: Radiopaque foreign body again demonstrated projected over the left upper quadrant. No apparent change in position since previous study. Electronically Signed   By: Burman Nieves M.D.   On: 03/08/2015 18:50   Dg Abd Acute W/chest  03/08/2015  CLINICAL DATA:  Ingested foreign body. Patient swallowed a razor blade. EXAM: DG ABDOMEN ACUTE W/ 1V CHEST COMPARISON:  01/17/2015 and 01/12/2015 FINDINGS: The upright chest x-ray is normal. No acute cardiopulmonary findings. No radiopaque foreign body. Two views of the abdomen demonstrate a radiopaque metallic foreign body in the region of the stomach in the left upper quadrant. Surgical changes are noted in the left upper quadrant. No findings for small bowel obstruction or free air. Moderate stool throughout the colon. The bony structures are intact. IMPRESSION: Radiopaque foreign body noted in the region of the stomach and left upper quadrant. Normal chest x-ray. No findings for bowel obstruction or free air. Electronically Signed   By: Rudie Meyer M.D.   On: 03/08/2015 16:07    EKG: No orders found for this or any previous visit.  IMPRESSION AND PLAN: * Ingested Foreign body   As per  discussion with GI and surgery, KUB x-ray  follow-ups every 12 hours.  Liquid diet for now and we can update later.  * Pain  Given Percocet for symptomatic control  * Depression  Continue Wellbutrin  Psychiatric consult.   All the records are reviewed and case discussed with ED provider. Management plans discussed with the patient, family and they are in agreement.  CODE STATUS:    Code Status Orders        Start     Ordered   03/08/15 2128  Full code   Continuous     03/08/15 2127       TOTAL TIME TAKING CARE OF THIS PATIENT:45 minutes.    Altamese Dilling M.D on 03/08/2015   Between 7am to 6pm - Pager - [phone]  After 6pm go to [email or web address] - password [email or web address] [facility]  [facility]  Office  [phone]  CC: Primary care physician; No primary care provider on file.   Note: This dictation was prepared with Dragon dictation along with smaller phrase technology. Any transcriptional errors that result from this process are unintentional.

## 2015-03-09 ENCOUNTER — Observation Stay: Payer: Medicaid Other

## 2015-03-09 ENCOUNTER — Encounter: Payer: Self-pay | Admitting: Gastroenterology

## 2015-03-09 DIAGNOSIS — T183XXA Foreign body in small intestine, initial encounter: Secondary | ICD-10-CM | POA: Diagnosis not present

## 2015-03-09 DIAGNOSIS — F489 Nonpsychotic mental disorder, unspecified: Secondary | ICD-10-CM

## 2015-03-09 LAB — CBC
HEMATOCRIT: 39.9 % — AB (ref 40.0–52.0)
HEMOGLOBIN: 13.7 g/dL (ref 13.0–18.0)
MCH: 30.2 pg (ref 26.0–34.0)
MCHC: 34.3 g/dL (ref 32.0–36.0)
MCV: 88.1 fL (ref 80.0–100.0)
Platelets: 226 10*3/uL (ref 150–440)
RBC: 4.53 MIL/uL (ref 4.40–5.90)
RDW: 17.1 % — ABNORMAL HIGH (ref 11.5–14.5)
WBC: 8.4 10*3/uL (ref 3.8–10.6)

## 2015-03-09 LAB — BASIC METABOLIC PANEL
ANION GAP: 7 (ref 5–15)
BUN: 12 mg/dL (ref 6–20)
CHLORIDE: 108 mmol/L (ref 101–111)
CO2: 23 mmol/L (ref 22–32)
Calcium: 9.1 mg/dL (ref 8.9–10.3)
Creatinine, Ser: 0.94 mg/dL (ref 0.61–1.24)
GFR calc non Af Amer: >60 mL/min (ref >60)
Glucose, Bld: 85 mg/dL (ref 65–99)
POTASSIUM: 3.6 mmol/L (ref 3.5–5.1)
SODIUM: 138 mmol/L (ref 135–145)

## 2015-03-09 MED ORDER — NICOTINE 21 MG/24HR TD PT24
21.0000 mg | MEDICATED_PATCH | Freq: Every day | TRANSDERMAL | Status: DC
  Administered 2015-03-09: 21 mg via TRANSDERMAL
  Filled 2015-03-09: qty 1

## 2015-03-09 MED ORDER — IPRATROPIUM-ALBUTEROL 0.5-2.5 (3) MG/3ML IN SOLN: 3.0000 mL | RESPIRATORY_TRACT | Status: DC | PRN

## 2015-03-09 MED ORDER — NICOTINE 10 MG IN INHA: 1.0000 | RESPIRATORY_TRACT | Status: DC | PRN

## 2015-03-09 MED ORDER — MORPHINE SULFATE (PF) 2 MG/ML IV SOLN
1.0000 mg | Freq: Once | INTRAVENOUS | Status: AC
  Administered 2015-03-09: 1 mg via INTRAVENOUS
  Filled 2015-03-09: qty 1

## 2015-03-09 MED ORDER — IPRATROPIUM-ALBUTEROL 0.5-2.5 (3) MG/3ML IN SOLN: 3.0000 mL | RESPIRATORY_TRACT | Status: DC

## 2015-03-09 MED ORDER — DOCUSATE SODIUM 100 MG PO CAPS
100.0000 mg | ORAL_CAPSULE | Freq: Two times a day (BID) | ORAL | Status: DC
  Administered 2015-03-09 (×2): 100 mg via ORAL
  Filled 2015-03-09 (×2): qty 1

## 2015-03-09 NOTE — Progress Notes (Signed)
Primary Nurse paged and spoke to Dr. Winona LegatoVaickute in concern to pt IVC orders. Orders received . Primary nurse to continue to monitor.

## 2015-03-09 NOTE — Progress Notes (Signed)
GI Note:  Doing well. Moved stool. Moving gas.   KUB: object now in pelvis.  ? Sigmoid.    Anticipat passing soon in stool.   Cont q12 KUB.

## 2015-03-09 NOTE — Consult Note (Signed)
Surgical Consultation  03/09/2015  Vincent Cruz is an 49 y.o. male.   CC: Ingested foreign body  HPI:  With an ingested foreign body. He did this to get out of jail and to experience a warm bed food and narcotics in the hospital. He has done this before and in fact has had 14 abdominal operations for ingested foreign bodies. He describes suggesting a broken dual bladed razor blade which he states that he "dull" and wrapped in cellophane so that it would not injure him as done this multiple times. He has had in numerous endoscopies both upper and lower.  Past Medical History  Diagnosis Date  . Self mutilating behavior   . Asthma   . Heart murmur   . Depression   . Arthritis     Past Surgical History  Procedure Laterality Date  . Abdominal surgery    . Hernia repair    . Fracture surgery    . Esophagogastroduodenoscopy N/A 03/08/2015    Procedure: ESOPHAGOGASTRODUODENOSCOPY (EGD);  Surgeon: Josefine Class, MD;  Location: [facility] ENDOSCOPY;  Service: Endoscopy;  Laterality: N/A;    History reviewed. No pertinent family history.  Social History:  reports that he has been smoking Cigarettes.  He does not have any smokeless tobacco history on file. He reports that he does not drink alcohol. His drug history is not on file.  Allergies:  Allergies  Allergen Reactions  . Propofol Other (See Comments)    Reaction:  Unknown     Medications reviewed.   Review of Systems:   Review of Systems  Constitutional: Negative.   HENT: Negative.   Eyes: Negative.   Respiratory: Negative.   Cardiovascular: Negative.   Gastrointestinal: Negative.   Genitourinary: Negative.   Musculoskeletal: Negative.   Skin: Negative.   Neurological: Negative.   Endo/Heme/Allergies: Negative.   Psychiatric/Behavioral: Negative.      Physical Exam:  BP 112/97 mmHg  Pulse 70  Temp(Src) 98.8 F (37.1 C) (Oral)  Resp 16  Ht 5' 5"  (1.651 m)  Wt 130 lb (58.968 kg)  BMI 21.63 kg/m2  SpO2  98%  Physical Exam  Constitutional: He is oriented to person, place, and time and well-developed, well-nourished, and in no distress.  HENT:  Head: Normocephalic and atraumatic.  Eyes: No scleral icterus.  Abdominal: Soft. He exhibits no distension. There is no tenderness. There is no rebound and no guarding.  Neurological: He is alert and oriented to person, place, and time.  Skin: Skin is warm and dry.  Multiple tattoos  Psychiatric: Affect normal.      Results for orders placed or performed during the hospital encounter of 03/08/15 (from the past 48 hour(s))  CBC     Status: Abnormal   Collection Time: 03/08/15  4:34 PM  Result Value Ref Range   WBC 5.4 3.8 - 10.6 K/uL   RBC 4.67 4.40 - 5.90 MIL/uL   Hemoglobin 14.2 13.0 - 18.0 g/dL   HCT 41.4 40.0 - 52.0 %   MCV 88.6 80.0 - 100.0 fL   MCH 30.3 26.0 - 34.0 pg   MCHC 34.2 32.0 - 36.0 g/dL   RDW 17.5 (H) 11.5 - 14.5 %   Platelets 284 150 - 440 K/uL  Comprehensive metabolic panel     Status: Abnormal   Collection Time: 03/08/15  4:34 PM  Result Value Ref Range   Sodium 139 135 - 145 mmol/L   Potassium 4.2 3.5 - 5.1 mmol/L   Chloride 106 101 - 111 mmol/L  CO2 26 22 - 32 mmol/L   Glucose, Bld 94 65 - 99 mg/dL   BUN 13 6 - 20 mg/dL   Creatinine, Ser 0.99 0.61 - 1.24 mg/dL   Calcium 9.7 8.9 - 10.3 mg/dL   Total Protein 7.4 6.5 - 8.1 g/dL   Albumin 4.5 3.5 - 5.0 g/dL   AST 20 15 - 41 U/L   ALT 16 (L) 17 - 63 U/L   Alkaline Phosphatase 63 38 - 126 U/L   Total Bilirubin 0.4 0.3 - 1.2 mg/dL   GFR calc non Af Amer >60 >60 mL/min   GFR calc Af Amer >60 >60 mL/min    Comment: (NOTE) The eGFR has been calculated using the CKD EPI equation. This calculation has not been validated in all clinical situations. eGFR's persistently <60 mL/min signify possible Chronic Kidney Disease.    Anion gap 7 5 - 15  APTT     Status: None   Collection Time: 03/08/15  4:34 PM  Result Value Ref Range   aPTT 30 24 - 36 seconds  Protime-INR      Status: None   Collection Time: 03/08/15  4:34 PM  Result Value Ref Range   Prothrombin Time 13.2 11.4 - 15.0 seconds   INR 8.76   Basic metabolic panel     Status: None   Collection Time: 03/09/15  4:21 AM  Result Value Ref Range   Sodium 138 135 - 145 mmol/L   Potassium 3.6 3.5 - 5.1 mmol/L   Chloride 108 101 - 111 mmol/L   CO2 23 22 - 32 mmol/L   Glucose, Bld 85 65 - 99 mg/dL   BUN 12 6 - 20 mg/dL   Creatinine, Ser 0.94 0.61 - 1.24 mg/dL   Calcium 9.1 8.9 - 10.3 mg/dL   GFR calc non Af Amer >60 >60 mL/min   GFR calc Af Amer >60 >60 mL/min    Comment: (NOTE) The eGFR has been calculated using the CKD EPI equation. This calculation has not been validated in all clinical situations. eGFR's persistently <60 mL/min signify possible Chronic Kidney Disease.    Anion gap 7 5 - 15  CBC     Status: Abnormal   Collection Time: 03/09/15  4:21 AM  Result Value Ref Range   WBC 8.4 3.8 - 10.6 K/uL   RBC 4.53 4.40 - 5.90 MIL/uL   Hemoglobin 13.7 13.0 - 18.0 g/dL   HCT 39.9 (L) 40.0 - 52.0 %   MCV 88.1 80.0 - 100.0 fL   MCH 30.2 26.0 - 34.0 pg   MCHC 34.3 32.0 - 36.0 g/dL   RDW 17.1 (H) 11.5 - 14.5 %   Platelets 226 150 - 440 K/uL   Dg Abd 1 View  03/09/2015  CLINICAL DATA:  Patient swallowed a razor blade. EXAM: ABDOMEN - 1 VIEW COMPARISON:  03/08/2015. FINDINGS: Small metallic density is noted in the pelvis. This could represent a swallowed foreign body including small portion of a razor blade. This was previously noted in the left upper quadrant. Dilated loops of small and large bowel are noted. Findings suggest adynamic ileus. Developing bowel obstruction cannot be excluded and follow-up exam suggested. Prominent amount of stool in colon. No free air. No acute bony abnormality identified. IMPRESSION: 1. Small metallic density noted projected over the mid pelvis. This could represent a swallowed foreign body including a small portion of a razor blade. This was previously noted in the  left upper quadrant 2. Slightly dilated loops of  small and large bowel suggesting adynamic ileus. Developing bowel obstruction cannot be excluded. No free air. Follow-up abdominal series suggested. Electronically Signed   By: Marcello Moores  Register   On: 03/09/2015 08:21   Dg Abd 1 View  03/08/2015  CLINICAL DATA:  Radiopaque foreign body EXAM: ABDOMEN - 1 VIEW COMPARISON:  Frontal view of abdomen obtained earlier in the day FINDINGS: Cross-table lateral view obtained. The radiopaque foreign body in is less well seen on the lateral view. It appears to be located anteriorly, likely in the colon near the splenic flexure. No free air. Bowel gas pattern normal. No bony abnormality. IMPRESSION: Radiopaque foreign body appears anterior in location on lateral view, likely in colon. No free air apparent. Electronically Signed   By: Lowella Grip III M.D.   On: 03/08/2015 19:37   Dg Abd 1 View  03/08/2015  CLINICAL DATA:  Patient had endoscopy to remove foreign body, razor blade, unable to locate. EXAM: ABDOMEN - 1 VIEW COMPARISON:  Chest and abdomen 03/08/2015 FINDINGS: Radiopaque foreign body again demonstrated projected over the left upper quadrant. Location is similar to the previous study. An dislocation, the structure could be in the stomach, left upper quadrant small bowel, or splenic flexure of the colon. An extrinsic superimposed structure is also not excluded on this single view study. Surgical clips in the left upper quadrant. Stool-filled colon. No small or large bowel distention. No radiopaque stones. Visualized bones appear intact with mild degenerative changes. IMPRESSION: Radiopaque foreign body again demonstrated projected over the left upper quadrant. No apparent change in position since previous study. Electronically Signed   By: Lucienne Capers M.D.   On: 03/08/2015 18:50   Dg Abd Acute W/chest  03/08/2015  CLINICAL DATA:  Ingested foreign body. Patient swallowed a razor blade. EXAM: DG ABDOMEN  ACUTE W/ 1V CHEST COMPARISON:  01/17/2015 and 01/12/2015 FINDINGS: The upright chest x-ray is normal. No acute cardiopulmonary findings. No radiopaque foreign body. Two views of the abdomen demonstrate a radiopaque metallic foreign body in the region of the stomach in the left upper quadrant. Surgical changes are noted in the left upper quadrant. No findings for small bowel obstruction or free air. Moderate stool throughout the colon. The bony structures are intact. IMPRESSION: Radiopaque foreign body noted in the region of the stomach and left upper quadrant. Normal chest x-ray. No findings for bowel obstruction or free air. Electronically Signed   By: Marijo Sanes M.D.   On: 03/08/2015 16:07    Assessment/Plan:  Abdominal films are personally reviewed. With the knowledge that this was wrapped in cellophane and purposely dulled to avoid injury and the fact that he has no sign of injury at this point I see no reason to recommend surgery. Fact that he is incarcerated dictate the need to have at least different approach to follow-up in that he needs frequent x-rays. This were to pass into the cecum it could be retrieved by colonoscopy and currently it appears to be in the small intestine or distal ileum. I see this patient is not needing any surgical intervention at this time but he could be followed by Dr.Rein. No surgical plans at this time  Florene Glen, MD, FACS

## 2015-03-09 NOTE — Progress Notes (Deleted)
GI Note:  Still with RUQ pain. Liver enzymes stable. LIpase nml. INR nml.   Not clear whether the abd pain and elev liver enzymes are related or two separate processes.   Awaiting MRCP resutls given the dialted CBD. ? CBD stone, ? Sphincter of Oddy dysfunction.   Further recs pending MRCP.  

## 2015-03-09 NOTE — Consult Note (Signed)
[facility] Consult   Reason for Consult:  Consult for this 49 year old man with a history of self mutilating behavior specifically to manipulate the criminal justice system who is currently brought back to the hospital because he intentionally swallowed a razor blade. Referring Physician:  Ether Griffins Patient Identification: Vincent Cruz MRN:  [id number] Principal Diagnosis: Self-mutilation Diagnosis:   Patient Active Problem List   Diagnosis Date Noted  . Foreign body in digestive system [T18.9XXA] 03/08/2015  . Borderline personality disorder [F60.3] 01/17/2015  . Cannabis abuse [F12.10] 01/17/2015  . Foreign body alimentary tract [T18.9XXA] 01/17/2015  . 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 Foreign body in stomach [T18.2XXA]   . Foreign body ingestion [T18.9XXA]     Total Time spent with patient: 45 minutes  Subjective:   Vincent Cruz is a 49 y.o. male patient admitted with "these people don't understand, I need to get out of here".  HPI:  Information from the patient and the chart. Patient interviewed. Chart reviewed. I met this patient previously in the hospital. Old chart reviewed labs reviewed. This 49 year old man was brought from the [facility] after swallowing a razor blade or segment of a razor blade. He told me the whole story but essentially it was part of a power struggle that he felt he could not back down from after he had threatened to swallow the razor blade if staff did not give him back his glasses. The patient is claiming to me that he told the razor blade on a metal sink in his cell and then wrapped it in plastic wrap before swallowing it to intentionally decrease his chance of hurting him. He states that his mood is not depressed. He is irritated at being in the hospital. He absolutely denies any wish to die. He does not report any psychotic symptoms. He is anxious to be released from the hospital because apparently if he is able to go to court  tomorrow there is a chance that he could be released from jail on probation but if he doesn't make his court date tomorrow it will be rescheduled for some unknown time in the future. He has been taking bupropion 150 [facility] of [facility] which has been changed back to 200 mg of sustained release since being back at [facility].  Social history: Patient is from [place]. He has family there but they refused to have contact with him. He intends to go stay at the homeless shelter in [place] when he gets out of jail. He has contacts with the mental health and social work system in [place] to try and gradually get himself some SSI and a place to live.  Medical history: Patient has a very extensive in fact remarkable history of self-mutilation and ingestion of foreign bodies in an attempt to manipulate the criminal justice system. This is his primary medical issue. No other known medical problems.  Substance abuse history: Patient has not been drinking or using drugs obviously since being in jail. Minimizes the degree to which it's been a problem outpatient.  Past Psychiatric History: Patient has had multiple episodes of psychiatric assessment related to the self mutilations and for an body swallowing's. He has been treated with antidepressants that of been of some benefit. He describes a long history of chronic impulsivity and hotheadedness but not of frank manic episodes and there is no history of psychosis. He says he has never intentionally tried to kill himself although he has  accidentally come close at times because of ignorance about anatomy when he was mutilating himself.  Risk to Self: Is patient at risk for suicide?: Yes Risk to Others:   Prior Inpatient Therapy:   Prior Outpatient Therapy:    Past Medical History:  Past Medical History  Diagnosis Date  . Self mutilating behavior   . Asthma   . Heart murmur   . Depression   . Arthritis     Past  Surgical History  Procedure Laterality Date  . Abdominal surgery    . Hernia repair    . Fracture surgery    . Esophagogastroduodenoscopy N/A 03/08/2015    Procedure: ESOPHAGOGASTRODUODENOSCOPY (EGD);  Surgeon: Josefine Class, MD;  Location: [facility];  Service: Endoscopy;  Laterality: N/A;   Family History: History reviewed. No pertinent family history. Family Psychiatric  History: Patient denies any significant family substance abuse or mental health treatment Social History:  History  Alcohol Use No     History  Drug Use Not on file    Social History   Social History  . Marital Status: Unknown    Spouse Name: N/A  . Number of Children: N/A  . Years of Education: N/A   Social History Main Topics  . Smoking status: Current Every Day Smoker    Types: Cigarettes  . Smokeless tobacco: None  . Alcohol Use: No  . Drug Use: None  . Sexual Activity: Not Asked   Other Topics Concern  . None   Social History Narrative   Additional Social History:                          Allergies:   Allergies  Allergen Reactions  . Propofol Other (See Comments)    Reaction:  Unknown     Labs:  Results for orders placed or performed during the hospital encounter of 03/08/15 (from the past 48 hour(s))  CBC     Status: Abnormal   Collection Time: 03/08/15  4:34 PM  Result Value Ref Range   WBC 5.4 3.8 - 10.6 K/uL   RBC 4.67 4.40 - 5.90 MIL/uL   Hemoglobin 14.2 13.0 - 18.0 g/dL   HCT 41.4 40.0 - 52.0 %   MCV 88.6 80.0 - 100.0 fL   MCH 30.3 26.0 - 34.0 pg   MCHC 34.2 32.0 - 36.0 g/dL   RDW 17.5 (H) 11.5 - 14.5 %   Platelets 284 150 - 440 K/uL  Comprehensive metabolic panel     Status: Abnormal   Collection Time: 03/08/15  4:34 PM  Result Value Ref Range   Sodium 139 135 - 145 mmol/L   Potassium 4.2 3.5 - 5.1 mmol/L   Chloride 106 101 - 111 mmol/L   CO2 26 22 - 32 mmol/L   Glucose, Bld 94 65 - 99 mg/dL   BUN 13 6 - 20 mg/dL   Creatinine, Ser 0.99 0.61 -  1.24 mg/dL   Calcium 9.7 8.9 - 10.3 mg/dL   Total Protein 7.4 6.5 - 8.1 g/dL   Albumin 4.5 3.5 - 5.0 g/dL   AST 20 15 - 41 U/L   ALT 16 (L) 17 - 63 U/L   Alkaline Phosphatase 63 38 - 126 U/L   Total Bilirubin 0.4 0.3 - 1.2 mg/dL   GFR calc non Af Amer >60 >60 mL/min   GFR calc Af Amer >60 >60 mL/min    Comment: (NOTE) The eGFR has been calculated using the CKD  EPI equation. This calculation has not been validated in all clinical situations. eGFR's persistently <60 mL/min signify possible Chronic Kidney Disease.    Anion gap 7 5 - 15  APTT     Status: None   Collection Time: 03/08/15  4:34 PM  Result Value Ref Range   aPTT 30 24 - 36 seconds  Protime-INR     Status: None   Collection Time: 03/08/15  4:34 PM  Result Value Ref Range   Prothrombin Time 13.2 11.4 - 15.0 seconds   INR 6.21   Basic metabolic panel     Status: None   Collection Time: 03/09/15  4:21 AM  Result Value Ref Range   Sodium 138 135 - 145 mmol/L   Potassium 3.6 3.5 - 5.1 mmol/L   Chloride 108 101 - 111 mmol/L   CO2 23 22 - 32 mmol/L   Glucose, Bld 85 65 - 99 mg/dL   BUN 12 6 - 20 mg/dL   Creatinine, Ser 0.94 0.61 - 1.24 mg/dL   Calcium 9.1 8.9 - 10.3 mg/dL   GFR calc non Af Amer >60 >60 mL/min   GFR calc Af Amer >60 >60 mL/min    Comment: (NOTE) The eGFR has been calculated using the CKD EPI equation. This calculation has not been validated in all clinical situations. eGFR's persistently <60 mL/min signify possible Chronic Kidney Disease.    Anion gap 7 5 - 15  CBC     Status: Abnormal   Collection Time: 03/09/15  4:21 AM  Result Value Ref Range   WBC 8.4 3.8 - 10.6 K/uL   RBC 4.53 4.40 - 5.90 MIL/uL   Hemoglobin 13.7 13.0 - 18.0 g/dL   HCT 39.9 (L) 40.0 - 52.0 %   MCV 88.1 80.0 - 100.0 fL   MCH 30.2 26.0 - 34.0 pg   MCHC 34.3 32.0 - 36.0 g/dL   RDW 17.1 (H) 11.5 - 14.5 %   Platelets 226 150 - 440 K/uL    Current Facility-Administered Medications  Medication Dose Route Frequency Provider  Last Rate Last Dose  . buPROPion [facility] SR) 12 hr tablet 200 mg  200 mg Oral BID Vaughan Basta, MD   200 mg at 03/09/15 0720  . docusate sodium (COLACE) capsule 100 mg  100 mg Oral BID Theodoro Grist, MD   100 mg at 03/09/15 1600  . ipratropium-albuterol (DUONEB) 0.5-2.5 (3) MG/3ML nebulizer solution 3 mL  3 mL Nebulization Q4H PRN Theodoro Grist, MD      . nicotine (NICODERM CQ - dosed in mg/24 hours) patch 21 mg  21 mg Transdermal Daily Theodoro Grist, MD   21 mg at 03/09/15 1600  . nicotine (NICOTROL) 10 MG inhaler 1 continuous puffing  1 continuous puffing Inhalation PRN Theodoro Grist, MD      . oxyCODONE-acetaminophen (PERCOCET/ROXICET) 5-325 MG per tablet 1 tablet  1 tablet Oral Q6H PRN Vaughan Basta, MD   1 tablet at 03/09/15 1148    Musculoskeletal: Strength & Muscle Tone: within normal limits Gait & Station: normal Patient leans: N/A  Psychiatric Specialty Exam: Review of Systems  Constitutional: Negative.   HENT: Negative.   Eyes: Negative.   Respiratory: Negative.   Cardiovascular: Negative.   Gastrointestinal: Negative.   Musculoskeletal: Negative.   Skin: Negative.   Neurological: Negative.   Psychiatric/Behavioral: Negative for depression, suicidal ideas, hallucinations, memory loss and substance abuse. The patient is nervous/anxious. The patient does not have insomnia.     Blood pressure 131/69, pulse 81, temperature  98.4 F (36.9 C), temperature source Oral, resp. rate 16, height 5' 5"  (1.651 m), weight 58.968 kg (130 lb), SpO2 98 %.Body mass index is 21.63 kg/(m^2).  General Appearance: Disheveled  Eye Contact::  Good  Speech:  Clear and Coherent  Volume:  Normal  Mood:  Euthymic  Affect:  Full Range  Thought Process:  Coherent and Goal Directed  Orientation:  Full (Time, Place, and Person)  Thought Content:  Negative  Suicidal Thoughts:  No  Homicidal Thoughts:  No  Memory:  Immediate;   Good Recent;   Good Remote;   Good  Judgement:   Other:  I guess the best that can be said about his judgment is that it is idiosyncratic but not crazy. He feels like and can justify how his behavior benefits him given his situation in jail.  Insight:  Fair  Psychomotor Activity:  Normal  Concentration:  Good  Recall:  Good  Fund of Knowledge:Good  Language: Good  Akathisia:  No  Handed:  Right  AIMS (if indicated):     Assets:  Resilience  ADL's:  Intact  Cognition: WNL  Sleep:      Treatment Plan Summary: Plan My assessment is that this patient is the somewhat unusual case of a person who is chronically dangerous to himself but not as the result of any mental illness. He has been given a diagnosis of borderline personality disorder and to some extent that fits. He does not appear to be psychotic however and while he is hot headed and impulsive his dangerous behavior mostly seems to be the result of clearly strategic attempts to manipulate the system such as what we are seeing now. I do not think therefore that he meets commitment criteria because I don't think there is any mental health issue here to be treated. I have discontinued his involuntary commitment. The patient was asking if I could discharge him from the hospital. I made it clear to him that I had no authority to do that but would pass on my assessment to the hospitalist. I spoke to the current hospitalist on duty, Dr. Posey Pronto, but because the patient is on the surgical service he did not feel that he had the authority to discharge the patient either. Patient can continue on the bupropion. No other psychiatric treatment needed at this point.  Disposition: Supportive therapy provided about ongoing stressors.  Damoney Julia 03/09/2015 7:48 PM

## 2015-03-09 NOTE — Progress Notes (Addendum)
[facility] - [place] at [facility]   PATIENT NAME: Vincent Cruz    MR#:  [id number]  DATE OF BIRTH:  07/28/1965  SUBJECTIVE:  CHIEF COMPLAINT:   Chief Complaint  Patient presents with  . Laceration  . Swallowed Foreign Body   patient is a 49 year old Caucasian male with past medical history significant for history of self mutilating behavior, depression who presents to the hospital after swallowing razor blade in jail. Patient was seen in emergency room and EGD was performed, but no foreign body was found. Patient did have KUB which revealed a razor blade in left upper quadrant, no significant bowel movement since before. Patient complains of some discomfort in epigastric area  Review of Systems  Gastrointestinal: Positive for abdominal pain.    VITAL SIGNS: Blood pressure 100/56, pulse 55, temperature 97.8 F (36.6 C), temperature source Oral, resp. rate 17, height  (1.651 m), weight 58.968 kg (130 lb), SpO2 96 %.  PHYSICAL EXAMINATION:   GENERAL:  49 y.o.-year-old patient lying in the bed with no acute distress.  EYES: Pupils equal, round, reactive to light and accommodation. No scleral icterus. Extraocular muscles intact.  HEENT: Head atraumatic, normocephalic. Oropharynx and nasopharynx clear.  NECK:  Supple, no jugular venous distention. No thyroid enlargement, no tenderness.  LUNGS: Diminished breath sounds bilaterally, no wheezing, rales,rhonchi or crepitation. No use of accessory muscles of respiration.  CARDIOVASCULAR: S1, S2 normal. No murmurs, rubs, or gallops.  ABDOMEN: Soft, nontender, nondistended. Bowel sounds present. No organomegaly or mass.  EXTREMITIES: No pedal edema, cyanosis, or clubbing.  NEUROLOGIC: Cranial nerves II through XII are intact. Muscle strength 5/5 in all extremities. Sensation intact. Gait not checked.  PSYCHIATRIC: The patient is alert and oriented x 3.  SKIN: No obvious rash, lesion, or ulcer.   ORDERS/RESULTS  REVIEWED:   CBC  Recent Labs Lab 03/08/15 1634 03/09/15 0421  WBC 5.4 8.4  HGB 14.2 13.7  HCT 41.4 39.9*  PLT 284 226  MCV 88.6 88.1  MCH 30.3 30.2  MCHC 34.2 34.3  RDW 17.5* 17.1*   ------------------------------------------------------------------------------------------------------------------  Chemistries   Recent Labs Lab 03/08/15 1634 03/09/15 0421  NA 139 138  K 4.2 3.6  CL 106 108  CO2 26 23  GLUCOSE 94 85  BUN 13 12  CREATININE 0.99 0.94  CALCIUM 9.7 9.1  AST 20  --   ALT 16*  --   ALKPHOS 63  --   BILITOT 0.4  --    ------------------------------------------------------------------------------------------------------------------ estimated creatinine clearance is 78.5 mL/min (by C-G formula based on Cr of 0.94). ------------------------------------------------------------------------------------------------------------------ No results for input(s): TSH, T4TOTAL, T3FREE, THYROIDAB in the last 72 hours.  Invalid input(s): FREET3  Cardiac Enzymes No results for input(s): CKMB, TROPONINI, MYOGLOBIN in the last 168 hours.  Invalid input(s): CK ------------------------------------------------------------------------------------------------------------------ Invalid input(s): POCBNP ---------------------------------------------------------------------------------------------------------------  RADIOLOGY: Dg Abd 1 View  03/09/2015  CLINICAL DATA:  Patient swallowed a razor blade. EXAM: ABDOMEN - 1 VIEW COMPARISON:  03/08/2015. FINDINGS: Small metallic density is noted in the pelvis. This could represent a swallowed foreign body including small portion of a razor blade. This was previously noted in the left upper quadrant. Dilated loops of small and large bowel are noted. Findings suggest adynamic ileus. Developing bowel obstruction cannot be excluded and follow-up exam suggested. Prominent amount of stool in colon. No free air. No acute bony abnormality  identified. IMPRESSION: 1. Small metallic density noted projected over the mid pelvis. This could represent a swallowed foreign body including a  small portion of a razor blade. This was previously noted in the left upper quadrant 2. Slightly dilated loops of small and large bowel suggesting adynamic ileus. Developing bowel obstruction cannot be excluded. No free air. Follow-up abdominal series suggested. Electronically Signed   By: Maisie Fushomas  Register   On: 03/09/2015 08:21   Dg Abd 1 View  03/08/2015  CLINICAL DATA:  Radiopaque foreign body EXAM: ABDOMEN - 1 VIEW COMPARISON:  Frontal view of abdomen obtained earlier in the day FINDINGS: Cross-table lateral view obtained. The radiopaque foreign body in is less well seen on the lateral view. It appears to be located anteriorly, likely in the colon near the splenic flexure. No free air. Bowel gas pattern normal. No bony abnormality. IMPRESSION: Radiopaque foreign body appears anterior in location on lateral view, likely in colon. No free air apparent. Electronically Signed   By: Bretta BangWilliam  Woodruff III M.D.   On: 03/08/2015 19:37   Dg Abd 1 View  03/08/2015  CLINICAL DATA:  Patient had endoscopy to remove foreign body, razor blade, unable to locate. EXAM: ABDOMEN - 1 VIEW COMPARISON:  Chest and abdomen 03/08/2015 FINDINGS: Radiopaque foreign body again demonstrated projected over the left upper quadrant. Location is similar to the previous study. An dislocation, the structure could be in the stomach, left upper quadrant small bowel, or splenic flexure of the colon. An extrinsic superimposed structure is also not excluded on this single view study. Surgical clips in the left upper quadrant. Stool-filled colon. No small or large bowel distention. No radiopaque stones. Visualized bones appear intact with mild degenerative changes. IMPRESSION: Radiopaque foreign body again demonstrated projected over the left upper quadrant. No apparent change in position since previous  study. Electronically Signed   By: Burman NievesWilliam  Stevens M.D.   On: 03/08/2015 18:50   Dg Abd Acute W/chest  03/08/2015  CLINICAL DATA:  Ingested foreign body. Patient swallowed a razor blade. EXAM: DG ABDOMEN ACUTE W/ 1V CHEST COMPARISON:  01/17/2015 and 01/12/2015 FINDINGS: The upright chest x-ray is normal. No acute cardiopulmonary findings. No radiopaque foreign body. Two views of the abdomen demonstrate a radiopaque metallic foreign body in the region of the stomach in the left upper quadrant. Surgical changes are noted in the left upper quadrant. No findings for small bowel obstruction or free air. Moderate stool throughout the colon. The bony structures are intact. IMPRESSION: Radiopaque foreign body noted in the region of the stomach and left upper quadrant. Normal chest x-ray. No findings for bowel obstruction or free air. Electronically Signed   By: Rudie MeyerP.  Gallerani M.D.   On: 03/08/2015 16:07    EKG: No orders found for this or any previous visit.  ASSESSMENT AND PLAN:  Active Problems:   Foreign body in digestive system 1. Foreign body in the digestive system, continue observation in the hospital. Get surgery involved for further recommendations, continue clear liquids for now, add Colace, no stool stimulants 2. Epigastric abdominal pain, unclear if it is related to razor blade since location is in left upper quadrant on x-ray, getting surgery involved 3. Hypotension, chronic, following 4. Relative hypoxia, likely due to long-standing tobacco abuse, underlying COPD, add duo nebs 5. Tobacco abuse. Discussed this patient for 4 minutes. Nicotine replacement therapy will be initiated 6. Questionable personality disorder, getting psychologist involved for recommendations. IVC is obtained   Management plans discussed with the patient, family and they are in agreement.   DRUG ALLERGIES:  Allergies  Allergen Reactions  . Propofol Other (See Comments)  Reaction:  Unknown     CODE STATUS:      Code Status Orders        Start     Ordered   03/08/15 2128  Full code   Continuous     03/08/15 2127      TOTAL TIME TAKING CARE OF THIS PATIENT: 45 minutes.  Coordination of care time for IVC preparation 15 to 20 minutes  Reynald Woods M.D on 03/09/2015 at 12:33 PM  Between 7am to 6pm - Pager - [phone]  After 6pm go to [email or web address] - password [email or web address] [facility]  [facility] [employer] [facility]  Office  [phone]  CC: Primary care physician; No primary care provider on file.

## 2015-03-10 NOTE — Discharge Summary (Signed)
[facility] [place] NAME: Vincent Cruz    MR#:  [id number]  DATE OF BIRTH:  15-Nov-1965  DATE OF ADMISSION:  03/08/2015 ADMITTING PHYSICIAN: Josefine Class, MD  DATE OF DISCHARGE: 03/10/2015  1:52 AM  PRIMARY CARE PHYSICIAN: No primary care provider on file.     ADMISSION DIAGNOSIS:  Laceration [T14.8] Foreign body in digestive tract, initial encounter [T18.9XXA]  DISCHARGE DIAGNOSIS:  Principal Problem:   Self-mutilation Active Problems:   Borderline personality disorder   Foreign body in digestive system   SECONDARY DIAGNOSIS:   Past Medical History  Diagnosis Date  . Self mutilating behavior   . Asthma   . Heart murmur   . Depression   . Arthritis     .pro HOSPITAL COURSE:  Patient is 49 year old Caucasian male with past history significant for history of manipulating behavior, asthma, heart murmur, depression who presents to the hospital after ingestion of razor blade in jail. Apparently patient ingested a broken dual razor blade, which she "dulled" and wrapped in cellophane so it would not injure him. He did this to get out of the jail and experience warmth of bed food to narcotics in the hospital. He has done this before and in fact had a 14 abdominal operations for ingested foreign bodies. He was admitted to the hospital and placed on IVC. He was seen by a surgeon gastroenterologist and psychiatrist. He underwent EGD by Dr. Rayann Heman first of November 2016 which revealed normal esophagus, stomach, as well as normal examined duodenum. Since no foreign body was found. The patient was admitted to the hospital with recommendations to repeat these. Abdominal x-rays every 12 hours to follow location of razor blade. He was seen by surgeon, who felt that there was no reason to surgery, but recommended to follow up by gastroenterologist. Psychiatrist, however, felt that patient very likely has a borderline personality disorder and  does not appear to be psychotic. According to psychiatrist use behavior was clearly strategic attempt to manipulate the system, he did not feel that patient needs IVC criteria, and it was discontinued. At that point. Patient decided to leave [place]. Discussion by problem 1. Foreign body in the digestive system, average patient does not meet IVC criteria, surgery did not feel that he needs surgical intervention, patient is to follow up with gastroenterologist, Dr. Rayann Heman, and when the blade has passed the  colon retrieve it via colonoscopy,  that was Indicated to him upon him leaving hospital [place] 2. Epigastric abdominal pain, unclear if it is a tall related to razor blade since location did not fit location on x-ray, no razor blade was seen on EGD, and surgeon did not feel that any surgical indications exist at that point, patient is to follow up with gastroenterologist as outpatient, or surgeon versus come to emergency room earlier if any change in his medical condition.   3. Hypotension, chronic, no intervention 4. Relative hypoxia, likely due to long-standing tobacco abuse, underlying COPD, patient was given some duo nebs while in the hospital, his O2 sats were 98% on discharge 5. Tobacco abuse. Discussed this patient for 4 minutes. Nicotine replacement therapy was given while in the hospital  6. Borderline personality disorder, appreciate psychologist input,  IVC was obtained, however. Psychiatrist did not feel that patient met criteria for IVC, patient left AGAINST MEDICAL ADVICE.    DISCHARGE CONDITIONS:   Stable  CONSULTS OBTAINED:  Treatment Team:  Gonzella Lex, MD  Florene Glen, MD  DRUG ALLERGIES:   Allergies  Allergen Reactions  . Propofol Other (See Comments)    Reaction:  Unknown     DISCHARGE MEDICATIONS:   Discharge Medication List as of 03/10/2015  1:52 AM    CONTINUE these medications which have NOT CHANGED   Details  buPROPion  (WELLBUTRIN SR) 200 MG 12 hr tablet Take 200 mg by mouth 2 (two) times daily., Until Discontinued, Historical Med         DISCHARGE INSTRUCTIONS:    Patient is to follow up with gastroenterologist, Dr. Rayann Heman as outpatient  If you experience worsening of your admission symptoms, develop shortness of breath, life threatening emergency, suicidal or homicidal thoughts you must seek medical attention immediately by calling 911 or calling your MD immediately  if symptoms less severe.  You Must read complete instructions/literature along with all the possible adverse reactions/side effects for all the Medicines you take and that have been prescribed to you. Take any new Medicines after you have completely understood and accept all the possible adverse reactions/side effects.   Please note  You were cared for by a hospitalist during your hospital stay. If you have any questions about your discharge medications or the care you received while you were in the hospital after you are discharged, you can call the unit and asked to speak with the hospitalist on call if the hospitalist that took care of you is not available. Once you are discharged, your primary care physician will handle any further medical issues. Please note that NO REFILLS for any discharge medications will be authorized once you are discharged, as it is imperative that you return to your primary care physician (or establish a relationship with a primary care physician if you do not have one) for your aftercare needs so that they can reassess your need for medications and monitor your lab values.    Today   CHIEF COMPLAINT:   Chief Complaint  Patient presents with  . Laceration  . Swallowed Foreign Body    HISTORY OF PRESENT ILLNESS:  Vincent Cruz  is a 49 y.o. male with a known history of manipulating behavior, asthma, heart murmur, depression who presents to the hospital after ingestion of razor blade in jail. Apparently patient  ingested a broken dual razor blade, which she "dulled" and wrapped in cellophane so it would not injure him. He did this to get out of the jail and experience warmth of bed food to narcotics in the hospital. He has done this before and in fact had a 14 abdominal operations for ingested foreign bodies. He was admitted to the hospital and placed on IVC. He was seen by a surgeon gastroenterologist and psychiatrist. He underwent EGD by Dr. Rayann Heman first of November 2016 which revealed normal esophagus, stomach, as well as normal examined duodenum. Since no foreign body was found. The patient was admitted to the hospital with recommendations to repeat these. Abdominal x-rays every 12 hours to follow location of razor blade. He was seen by surgeon, who felt that there was no reason to surgery, but recommended to follow up by gastroenterologist. Psychiatrist, however, felt that patient very likely has a borderline personality disorder and does not appear to be psychotic. According to psychiatrist use behavior was clearly strategic attempt to manipulate the system, he did not feel that patient needs IVC criteria, and it was discontinued. At that point. Patient decided to leave [place]. Discussion by problem 1. Foreign body in the  digestive system, average patient does not meet IVC criteria, surgery did not feel that he needs surgical intervention, patient is to follow up with gastroenterologist, Dr. Rayann Heman, and when the blade has passed the  colon retrieve it via colonoscopy,  that was Indicated to him upon him leaving hospital [place] 2. Epigastric abdominal pain, unclear if it is a tall related to razor blade since location did not fit location on x-ray, no razor blade was seen on EGD, and surgeon did not feel that any surgical indications exist at that point, patient is to follow up with gastroenterologist as outpatient, or surgeon versus come to emergency room earlier if any change in his  medical condition.   3. Hypotension, chronic, no intervention 4. Relative hypoxia, likely due to long-standing tobacco abuse, underlying COPD, patient was given some duo nebs while in the hospital, his O2 sats were 98% on discharge 5. Tobacco abuse. Discussed this patient for 4 minutes. Nicotine replacement therapy was given while in the hospital  6. Borderline personality disorder, appreciate psychologist input,  IVC was obtained, however. Psychiatrist did not feel that patient met criteria for IVC, patient left AGAINST MEDICAL ADVICE.     VITAL SIGNS:  Blood pressure 109/57, pulse 81, temperature 97.8 F (36.6 C), temperature source Oral, resp. rate 16, height 5' 5"  (1.651 m), weight 58.968 kg (130 lb), SpO2 98 %.  I/O:   Intake/Output Summary (Last 24 hours) at 03/10/15 1226 Last data filed at 03/10/15 0113  Gross per 24 hour  Intake    240 ml  Output   2000 ml  Net  -1760 ml    PHYSICAL EXAMINATION:  GENERAL:  49 y.o.-year-old patient lying in the bed with no acute distress.  EYES: Pupils equal, round, reactive to light and accommodation. No scleral icterus. Extraocular muscles intact.  HEENT: Head atraumatic, normocephalic. Oropharynx and nasopharynx clear.  NECK:  Supple, no jugular venous distention. No thyroid enlargement, no tenderness.  LUNGS: Normal breath sounds bilaterally, no wheezing, rales,rhonchi or crepitation. No use of accessory muscles of respiration.  CARDIOVASCULAR: S1, S2 normal. No murmurs, rubs, or gallops.  ABDOMEN: Soft, non-tender, non-distended. Bowel sounds present. No organomegaly or mass.  EXTREMITIES: No pedal edema, cyanosis, or clubbing.  NEUROLOGIC: Cranial nerves II through XII are intact. Muscle strength 5/5 in all extremities. Sensation intact. Gait not checked.  PSYCHIATRIC: The patient is alert and oriented x 3.  SKIN: No obvious rash, lesion, or ulcer.   DATA REVIEW:   CBC  Recent Labs Lab 03/09/15 0421  WBC 8.4  HGB 13.7  HCT  39.9*  PLT 226    Chemistries   Recent Labs Lab 03/08/15 1634 03/09/15 0421  NA 139 138  K 4.2 3.6  CL 106 108  CO2 26 23  GLUCOSE 94 85  BUN 13 12  CREATININE 0.99 0.94  CALCIUM 9.7 9.1  AST 20  --   ALT 16*  --   ALKPHOS 63  --   BILITOT 0.4  --     Cardiac Enzymes No results for input(s): TROPONINI in the last 168 hours.  Microbiology Results  No results found for this or any previous visit.  RADIOLOGY:  Dg Abd 1 View  03/09/2015  CLINICAL DATA:  Patient swallowed a razor blade. EXAM: ABDOMEN - 1 VIEW COMPARISON:  03/08/2015. FINDINGS: Small metallic density is noted in the pelvis. This could represent a swallowed foreign body including small portion of a razor blade. This was previously noted in the left  upper quadrant. Dilated loops of small and large bowel are noted. Findings suggest adynamic ileus. Developing bowel obstruction cannot be excluded and follow-up exam suggested. Prominent amount of stool in colon. No free air. No acute bony abnormality identified. IMPRESSION: 1. Small metallic density noted projected over the mid pelvis. This could represent a swallowed foreign body including a small portion of a razor blade. This was previously noted in the left upper quadrant 2. Slightly dilated loops of small and large bowel suggesting adynamic ileus. Developing bowel obstruction cannot be excluded. No free air. Follow-up abdominal series suggested. Electronically Signed   By: Marcello Moores  Register   On: 03/09/2015 08:21   Dg Abd 1 View  03/08/2015  CLINICAL DATA:  Radiopaque foreign body EXAM: ABDOMEN - 1 VIEW COMPARISON:  Frontal view of abdomen obtained earlier in the day FINDINGS: Cross-table lateral view obtained. The radiopaque foreign body in is less well seen on the lateral view. It appears to be located anteriorly, likely in the colon near the splenic flexure. No free air. Bowel gas pattern normal. No bony abnormality. IMPRESSION: Radiopaque foreign body appears anterior  in location on lateral view, likely in colon. No free air apparent. Electronically Signed   By: Lowella Grip III M.D.   On: 03/08/2015 19:37   Dg Abd 1 View  03/08/2015  CLINICAL DATA:  Patient had endoscopy to remove foreign body, razor blade, unable to locate. EXAM: ABDOMEN - 1 VIEW COMPARISON:  Chest and abdomen 03/08/2015 FINDINGS: Radiopaque foreign body again demonstrated projected over the left upper quadrant. Location is similar to the previous study. An dislocation, the structure could be in the stomach, left upper quadrant small bowel, or splenic flexure of the colon. An extrinsic superimposed structure is also not excluded on this single view study. Surgical clips in the left upper quadrant. Stool-filled colon. No small or large bowel distention. No radiopaque stones. Visualized bones appear intact with mild degenerative changes. IMPRESSION: Radiopaque foreign body again demonstrated projected over the left upper quadrant. No apparent change in position since previous study. Electronically Signed   By: Lucienne Capers M.D.   On: 03/08/2015 18:50   Dg Abd 2 Views  03/10/2015  CLINICAL DATA:  Swallowed razor blade. EXAM: ABDOMEN - 2 VIEW COMPARISON:  03/09/2015. FINDINGS: Previously identified razor blade projected over the pelvis is now projected over the left upper quadrant. Persistent air-filled loops of small and large bowel noted consistent with adynamic ileus. No free air. Continued follow-up abdominal series suggested to exclude developing bowel obstruction and to demonstrate passage of the razor blade. Surgical sutures upper abdomen. No acute bony abnormality. IMPRESSION: 1. Razor blade previously identified projected over the pelvis is now projected over the left upper quadrant. 2. Persistent air-filled loops of small large bowel most consistent with adynamic ileus. Continued follow-up abdominal series suggested to exclude developing bowel obstruction and to demonstrate passage of a  razor blade. Electronically Signed   By: Marcello Moores  Register   On: 03/10/2015 08:18   Dg Abd Acute W/chest  03/08/2015  CLINICAL DATA:  Ingested foreign body. Patient swallowed a razor blade. EXAM: DG ABDOMEN ACUTE W/ 1V CHEST COMPARISON:  01/17/2015 and 01/12/2015 FINDINGS: The upright chest x-ray is normal. No acute cardiopulmonary findings. No radiopaque foreign body. Two views of the abdomen demonstrate a radiopaque metallic foreign body in the region of the stomach in the left upper quadrant. Surgical changes are noted in the left upper quadrant. No findings for small bowel obstruction or free air. Moderate stool  throughout the colon. The bony structures are intact. IMPRESSION: Radiopaque foreign body noted in the region of the stomach and left upper quadrant. Normal chest x-ray. No findings for bowel obstruction or free air. Electronically Signed   By: Marijo Sanes M.D.   On: 03/08/2015 16:07    EKG:  No orders found for this or any previous visit.    Management plans discussed with the patient, family and they are in agreement.  CODE STATUS:   TOTAL TIME TAKING CARE OF THIS PATIENT: 40 minutes.    Theodoro Grist M.D on 03/10/2015 at 12:26 PM  Between 7am to 6pm - Pager - [phone]  After 6pm go to [email or web address] - password [email or web address] [facility]  [facility]  Office  [phone]  CC: Primary care physician; No primary care provider on file.

## 2015-03-10 NOTE — Progress Notes (Signed)
Patient no longer IVC as per psychiatry, no surgical plans per surgery The patient now requests to leave AGAINST MEDICAL ADVICE and return to prison He is understanding that this is AGAINST MEDICAL ADVICE, and may lead to harm/death however he is in agreement

## 2015-03-10 NOTE — Progress Notes (Signed)
Pt requested to speak to RN.  States that he has been cleared by psych and is no longer on IVC. Pr Dr.Clapacs notes this is true.  He wishes to sign AMA papers and return to jail. Notified Dr.Hower and he is ok with this and Supervisor notified as well. Pt signed AMA papers and officer in room waiting on other office to transport back to jail. Pt in stable condition at this time.

## 2015-10-27 ENCOUNTER — Other Ambulatory Visit: Payer: Self-pay

## 2015-10-27 ENCOUNTER — Emergency Department
Admission: EM | Admit: 2015-10-27 | Discharge: 2015-10-27 | Payer: Medicaid Other | Attending: Emergency Medicine | Admitting: Emergency Medicine

## 2015-10-27 ENCOUNTER — Emergency Department
Admission: EM | Admit: 2015-10-27 | Discharge: 2015-10-27 | Disposition: A | Discharge status: Home / Self Care | Payer: Medicaid Other | Attending: Emergency Medicine | Admitting: Emergency Medicine

## 2015-10-27 DIAGNOSIS — J45909 Unspecified asthma, uncomplicated: Secondary | ICD-10-CM | POA: Insufficient documentation

## 2015-10-27 DIAGNOSIS — F329 Major depressive disorder, single episode, unspecified: Secondary | ICD-10-CM | POA: Insufficient documentation

## 2015-10-27 DIAGNOSIS — F17210 Nicotine dependence, cigarettes, uncomplicated: Secondary | ICD-10-CM | POA: Insufficient documentation

## 2015-10-27 DIAGNOSIS — F603 Borderline personality disorder: Secondary | ICD-10-CM

## 2015-10-27 DIAGNOSIS — M1990 Unspecified osteoarthritis, unspecified site: Secondary | ICD-10-CM | POA: Insufficient documentation

## 2015-10-27 DIAGNOSIS — R002 Palpitations: Secondary | ICD-10-CM

## 2015-10-27 LAB — COMPREHENSIVE METABOLIC PANEL
ALBUMIN: 4.5 g/dL (ref 3.5–5.0)
ALK PHOS: 59 U/L (ref 38–126)
ALT: 18 U/L (ref 17–63)
AST: 30 U/L (ref 15–41)
Anion gap: 8 (ref 5–15)
BUN: 13 mg/dL (ref 6–20)
CALCIUM: 9.1 mg/dL (ref 8.9–10.3)
CHLORIDE: 105 mmol/L (ref 101–111)
CO2: 24 mmol/L (ref 22–32)
CREATININE: 1.04 mg/dL (ref 0.61–1.24)
GFR calc non Af Amer: >60 mL/min (ref >60)
GLUCOSE: 93 mg/dL (ref 65–99)
Potassium: 3.3 mmol/L — ABNORMAL LOW (ref 3.5–5.1)
SODIUM: 137 mmol/L (ref 135–145)
Total Bilirubin: 0.7 mg/dL (ref 0.3–1.2)
Total Protein: 7 g/dL (ref 6.5–8.1)

## 2015-10-27 LAB — CBC
HEMATOCRIT: 41 % (ref 40.0–52.0)
Hemoglobin: 14.2 g/dL (ref 13.0–18.0)
MCH: 32.3 pg (ref 26.0–34.0)
MCHC: 34.7 g/dL (ref 32.0–36.0)
MCV: 93.3 fL (ref 80.0–100.0)
Platelets: 272 10*3/uL (ref 150–440)
RBC: 4.39 MIL/uL — AB (ref 4.40–5.90)
RDW: 12.9 % (ref 11.5–14.5)
WBC: 6.4 10*3/uL (ref 3.8–10.6)

## 2015-10-27 LAB — URINE DRUG SCREEN, QUALITATIVE (ARMC ONLY)
Amphetamines, Ur Screen: NOT DETECTED
Barbiturates, Ur Screen: NOT DETECTED
Benzodiazepine, Ur Scrn: NOT DETECTED
Cannabinoid 50 Ng, Ur ~~LOC~~: POSITIVE — AB
Cocaine Metabolite,Ur ~~LOC~~: NOT DETECTED
MDMA (Ecstasy)Ur Screen: NOT DETECTED
Methadone Scn, Ur: NOT DETECTED
Opiate, Ur Screen: NOT DETECTED
Phencyclidine (PCP) Ur S: NOT DETECTED
Tricyclic, Ur Screen: NOT DETECTED

## 2015-10-27 LAB — ETHANOL: Alcohol, Ethyl (B): <5 mg/dL

## 2015-10-27 LAB — ACETAMINOPHEN LEVEL: Acetaminophen (Tylenol), Serum: <10 ug/mL — ABNORMAL LOW (ref 10–30)

## 2015-10-27 LAB — SALICYLATE LEVEL: Salicylate Lvl: <4 mg/dL (ref 2.8–30.0)

## 2015-10-27 NOTE — Discharge Instructions (Signed)

## 2015-10-27 NOTE — ED Notes (Signed)
Patient to ED via EMS for "my heart is racing." Patient was outside at the shelter, has had one "big [employer] beer" and his heart started racing. Patient denies drug use except for marijuana but "has no money to get none" but "I would if I had money." Patient is calm and cooperative. Old healing scars noted on both arms from cutting but states he doesn't do that anymore.

## 2015-10-27 NOTE — ED Notes (Signed)
Pt arrives to ER with Probation officers from the [facility] with c/o overdose. Pt states that he crushed 2 of his prescribed Wellbutrin tablets and took them orally in front of everyone because he "feels like doing something crazy". Pt states that he wants to hurt himself. Denies HI. Pt appears agitated.

## 2015-10-27 NOTE — Discharge Instructions (Signed)
Borderline Personality Disorder Borderline personality disorder is a mental health disorder. People with borderline personality disorder have unhealthy patterns of perceiving, thinking about, and reacting to their environment and events in their life. These patterns are established by adolescence or early adulthood. People with borderline personality disorder also have difficulty coping with stress on their own and fear being abandoned by others. They have difficulty controlling their emotions. Their emotions change quickly, frequently, and intensely. They are easily upset and can become very angry, very suddenly. Their unpredictable behavior often leads to problems in their relationships. They often feel worthless, unloved, and emotionally empty. CAUSES No one knows the exact cause of borderline personality disorder. Most mental health experts think that there is more than one cause. Possible contributing factors include:  Genetic factors. These are traits that are passed down from one generation to the next. Many people with borderline personality disorder have a family history of the disorder.  Physical factors. The part of the brain that controls emotion may be different in people who have borderline personality disorder.  Social factors. Traumatic experiences involving other people may play a role in the development of borderline personality disorder. Examples include neglect, abandonment, and physical and sexual abuse. SYMPTOMS Signs and symptoms of borderline personality disorder include:  A series of unstable personal relationships.  A strong fear of being abandoned and frantic efforts to avoid abandonment.  Impulsive, self-destructive behavior, such as substance abuse, irrational spending of money, unprotected sex with multiple partners, reckless driving, and binge eating.  Poor self-image that also may change a lot, or a sense of identity that is inconsistent.  Recurring self-injury or  attempted suicide.  Severe mood swings, including depression, irritability, and anxiety.  Lasting feelings of emptiness.  Difficulty controlling anger.  Temporary feelings of paranoia or loss of touch with reality. DIAGNOSIS A diagnosis of borderline personality disorder requires the presence of at least 5 of the common signs and symptoms. This information is gathered from family and friends as well as medical professionals and legal professionals who have a close association with the patient. This information is also gathered during a psychiatric assessment. During the assessment, the patient is asked about early life experiences, level of education, employment status, physical health conditions, and current prescription and over-the-counter medicines used. TREATMENT Caregivers who usually treat borderline personality disorder are mental health professionals, such as psychologists, psychiatrists, and clinical social workers. More than one type of treatment may be needed. Types of treatment include:  Psychotherapy (also known as talk therapy or counseling).  Cognitive behavioral therapy. This helps the person to recognize and change unhealthy feelings, thoughts, and behaviors. They find new, more positive thoughts and actions to replace the old ones.  Dialectical behavioral therapy. Through this type of treatment, a person learns to understand his or her feelings and to regulate them. This may be one-on-one treatment or part of group therapy.  Family therapy. This treatment includes family members.  Medicine. Medicine may be used to help control emotions, reduce reckless and self-destructive behavior, treat anxiety, and treat depression. SEEK IMMEDIATE MEDICAL CARE IF:   You cannot control your behavior or emotions.  You think about hurting yourself.  You think about suicide. FOR MORE INFORMATION National Alliance on Mental Illness: www.nami.org [employer]  Health: http://www.maynard.net/www.nimh.nih.gov Borderline Personality Resource Center: http://bpdresourcecenter.org   This information is not intended to replace advice given to you by your health care provider. Make sure you discuss any questions you have with your health  care provider.   Document Released: 08/08/2010 Document Revised: 07/16/2011 Document Reviewed: 08/08/2010 Elsevier Interactive Patient Education [employer]2016 [employer] Inc.

## 2015-10-27 NOTE — ED Provider Notes (Signed)
[facility] Emergency Department Provider Note  ____________________________________________   I have reviewed the triage vital signs and the nursing notes.   HISTORY  Chief Complaint Drug Overdose    HPI Vincent Cruz is a 50 y.o. male states that he was getting arrested and he took his normal Wellbutrin and crush them and took them because they were going to take him away from him. He has no SI or HI at this time. He states he is much calmer now. Patient is well-known to this facility. Patient states that at no time does he really want to hurt himself. He was initially agitated and he states that happens to him he gets arrested. However, he contracts for safety and has no thoughts of hurting himself or anyone else. He denies any thoughts of hurting himself without ingestion he denies any other ingestion. It was [place] [facility].   Past Medical History  Diagnosis Date  . Self mutilating behavior   . Asthma   . Heart murmur   . Depression   . Arthritis     Patient Active Problem List   Diagnosis Date Noted  . Foreign body in digestive system 03/08/2015  . Borderline personality disorder 01/17/2015  . Cannabis abuse 01/17/2015  . Foreign body alimentary tract 01/17/2015  . 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 Foreign body in stomach   . Foreign body ingestion     Past Surgical History  Procedure Laterality Date  . Abdominal surgery    . Hernia repair    . Fracture surgery    . Esophagogastroduodenoscopy N/A 03/08/2015    Procedure: ESOPHAGOGASTRODUODENOSCOPY (EGD);  Surgeon: Elnita MaxwellMatthew Gordon Rein, MD;  Location: [facility] ENDOSCOPY;  Service: Endoscopy;  Laterality: N/A;    Current Outpatient Rx  Name  Route  Sig  Dispense  Refill  . buPROPion (WELLBUTRIN SR) 200 MG 12 hr tablet   Oral   Take 200 mg by mouth 2 (two) times daily.           Allergies Propofol  No family history on file.  Social History Social History  Substance Use Topics   . Smoking status: Current Every Day Smoker    Types: Cigarettes  . Smokeless tobacco: None  . Alcohol Use: No    Review of Systems Constitutional: No fever/chills Eyes: No visual changes. ENT: No sore throat. No stiff neck no neck pain Cardiovascular: Denies chest pain. Respiratory: Denies shortness of breath. Gastrointestinal:   no vomiting.  No diarrhea.  No constipation. Genitourinary: Negative for dysuria. Musculoskeletal: Negative lower extremity swelling Skin: Negative for rash. Neurological: Negative for headaches, focal weakness or numbness. 10-point ROS otherwise negative.  ____________________________________________   PHYSICAL EXAM:  VITAL SIGNS: ED Triage Vitals  Enc Vitals Group     BP 10/27/15 1729 175/117 mmHg     Pulse Rate 10/27/15 1729 98     Resp 10/27/15 1729 18     Temp 10/27/15 1729 98.4 F (36.9 C)     Temp Source 10/27/15 1729 Oral     SpO2 10/27/15 1729 96 %     Weight 10/27/15 1729 125 lb (56.7 kg)     Height --      Head Cir --      Peak Flow --      Pain Score --      Pain Loc --      Pain Edu? --      Excl. in GC? --     Constitutional: Alert and oriented.  Well appearing and in no acute distress. Eyes: Conjunctivae are normal. PERRL. EOMI. Head: Atraumatic. Nose: No congestion/rhinnorhea. Mouth/Throat: Mucous membranes are moist.  Oropharynx non-erythematous. Neck: No stridor.   Nontender with no meningismus Cardiovascular: Normal rate, regular rhythm. Grossly normal heart sounds.  Good peripheral circulation. Respiratory: Normal respiratory effort.  No retractions. Lungs CTAB. Abdominal: Soft and nontender. No distention. No guarding no rebound Back:  There is no focal tenderness or step off there is no midline tenderness there are no lesions noted. there is no CVA tenderness  Musculoskeletal: No lower extremity tenderness. No joint effusions, no DVT signs strong distal pulses no edema Neurologic:  Normal speech and language. No  gross focal neurologic deficits are appreciated.  Skin:  Skin is warm, dry and intact. No rash noted. Psychiatric: Mood and affect are normal. Speech and behavior are normal.  ____________________________________________   LABS (all labs ordered are listed, but only abnormal results are displayed)  Labs Reviewed  COMPREHENSIVE METABOLIC PANEL  ETHANOL  CBC  URINE DRUG SCREEN, QUALITATIVE ([facility] ONLY)  SALICYLATE LEVEL  ACETAMINOPHEN LEVEL   ____________________________________________  EKG  I personally interpreted any EKGs ordered by me or triage  ____________________________________________  RADIOLOGY  I reviewed any imaging ordered by me or triage that were performed during my shift and, if possible, patient and/or family made aware of any abnormal findings. ____________________________________________   PROCEDURES  Procedure(s) performed: None  Critical Care performed: None  ____________________________________________   INITIAL IMPRESSION / ASSESSMENT AND PLAN / ED COURSE  Pertinent labs & imaging results that were available during my care of the patient were reviewed by me and considered in my medical decision making (see chart for details).  Patient with no evidence of acute pathology today. We will discharge him. ____________________________________________   FINAL CLINICAL IMPRESSION(S) / ED DIAGNOSES  Final diagnoses:  None      This chart was dictated using voice recognition software.  Despite best efforts to proofread,  errors can occur which can change meaning.     Jeanmarie PlantJames A Margarie Mcguirt, MD 10/27/15 1919

## 2015-10-27 NOTE — ED Provider Notes (Signed)
[facility] Emergency Department Provider Note  ____________________________________________  Time seen: 6:15 AM  I have reviewed the triage vital signs and the nursing notes.   HISTORY  Chief Complaint Tachycardia     HPI Vincent Cruz is a 50 y.o. male resents to the emergency department via EMS with "my heart was racing". Patient was outside the homeless shelter where he said he drank "two big ice house beers". Patient denies any chest pain or dyspnea. Patient states that he's had episodes of palpitations in the past and had an echocardiogram performed while in prison which she stated was normal.     Past Medical History  Diagnosis Date  . Self mutilating behavior   . Asthma   . Heart murmur   . Depression   . Arthritis     Patient Active Problem List   Diagnosis Date Noted  . Foreign body in digestive system 03/08/2015  . Borderline personality disorder 01/17/2015  . Cannabis abuse 01/17/2015  . Foreign body alimentary tract 01/17/2015  . 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 Foreign body in stomach   . Foreign body ingestion     Past Surgical History  Procedure Laterality Date  . Abdominal surgery    . Hernia repair    . Fracture surgery    . Esophagogastroduodenoscopy N/A 03/08/2015    Procedure: ESOPHAGOGASTRODUODENOSCOPY (EGD);  Surgeon: Elnita MaxwellMatthew Gordon Rein, MD;  [facility];  Service: Endoscopy;  Laterality: N/A;    Current Outpatient Rx  Name  Route  Sig  Dispense  Refill  . buPROPion (WELLBUTRIN SR) 200 MG 12 hr tablet   Oral   Take 200 mg by mouth 2 (two) times daily.           Allergies Propofol  No family history on file.  Social History Social History  Substance Use Topics  . Smoking status: Current Every Day Smoker    Types: Cigarettes  . Smokeless tobacco: None  . Alcohol Use: No    Review of Systems  Constitutional: Negative for fever. Eyes: Negative for visual changes. ENT: Negative for sore  throat. Cardiovascular: Negative for chest pain.Positive palpitations Respiratory: Negative for shortness of breath. Gastrointestinal: Negative for abdominal pain, vomiting and diarrhea. Genitourinary: Negative for dysuria. Musculoskeletal: Negative for back pain. Skin: Negative for rash. Neurological: Negative for headaches, focal weakness or numbness.   10-point ROS otherwise negative.  ____________________________________________   PHYSICAL EXAM:  VITAL SIGNS: ED Triage Vitals  Enc Vitals Group     BP 10/27/15 0216 121/84 mmHg     Pulse Rate 10/27/15 0216 100     Resp 10/27/15 0216 20     Temp 10/27/15 0216 98.7 F (37.1 C)     Temp Source 10/27/15 0216 Oral     SpO2 10/27/15 0216 96 %     Weight 10/27/15 0216 125 lb (56.7 kg)     Height 10/27/15 0216 5\' 5"  (1.651 m)     Head Cir --      Peak Flow --      Pain Score 10/27/15 0220 3     Pain Loc --      Pain Edu? --      Excl. in GC? --      Constitutional: Alert and oriented. Well appearing and in no distress. Eyes: Conjunctivae are normal. PERRL. Normal extraocular movements. ENT   Head: Normocephalic and atraumatic.   Nose: No congestion/rhinnorhea.   Mouth/Throat: Mucous membranes are moist.   Neck: No stridor. Hematological/Lymphatic/Immunilogical: No cervical lymphadenopathy.  Cardiovascular: Normal rate, regular rhythm. Normal and symmetric distal pulses are present in all extremities. No murmurs, rubs, or gallops. Respiratory: Normal respiratory effort without tachypnea nor retractions. Breath sounds are clear and equal bilaterally. No wheezes/rales/rhonchi. Gastrointestinal: Soft and nontender. No distention. There is no CVA tenderness. Genitourinary: deferred Musculoskeletal: Nontender with normal range of motion in all extremities. No joint effusions.  No lower extremity tenderness nor edema. Neurologic:  Normal speech and language. No gross focal neurologic deficits are appreciated. Speech  is normal.  Skin:  Skin is warm, dry and intact. No rash noted. Psychiatric: Mood and affect are normal. Speech and behavior are normal. Patient exhibits appropriate insight and judgment.     EKG  ED ECG REPORT I,  N Loden Laurent, the attending physician, personally viewed and interpreted this ECG.   Date: 10/27/2015  EKG Time: 2:22 AM  Rate: 95  Rhythm: Normal sinus rhythm  Axis: Normal  Intervals: Normal  ST&T Change: None   ____________________________________________  INITIAL IMPRESSION / ASSESSMENT AND PLAN / ED COURSE  Pertinent labs & imaging results that were available during my care of the patient were reviewed by me and considered in my medical decision making (see chart for details).  Patient EKG revealed no evidence of arrhythmia. Patient observed in the emergency department on the monitor with no arrhythmia noted. Refer the patient to cardiology for further outpatient evaluation and management ____________________________________________   FINAL CLINICAL IMPRESSION(S) / ED DIAGNOSES  Final diagnoses:  Heart palpitations      Darci Currentandolph N Gaither Biehn, MD 10/27/15 [phone]

## 2015-12-20 ENCOUNTER — Emergency Department
Admission: EM | Admit: 2015-12-20 | Discharge: 2015-12-20 | Disposition: A | Discharge status: Home / Self Care | Attending: Emergency Medicine | Admitting: Emergency Medicine

## 2015-12-20 ENCOUNTER — Encounter: Payer: Self-pay | Admitting: Emergency Medicine

## 2015-12-20 ENCOUNTER — Emergency Department

## 2015-12-20 DIAGNOSIS — F17210 Nicotine dependence, cigarettes, uncomplicated: Secondary | ICD-10-CM | POA: Insufficient documentation

## 2015-12-20 DIAGNOSIS — Z791 Long term (current) use of non-steroidal anti-inflammatories (NSAID): Secondary | ICD-10-CM | POA: Insufficient documentation

## 2015-12-20 DIAGNOSIS — T189XXA Foreign body of alimentary tract, part unspecified, initial encounter: Secondary | ICD-10-CM

## 2015-12-20 DIAGNOSIS — T182XXA Foreign body in stomach, initial encounter: Secondary | ICD-10-CM

## 2015-12-20 DIAGNOSIS — J45909 Unspecified asthma, uncomplicated: Secondary | ICD-10-CM | POA: Insufficient documentation

## 2015-12-20 DIAGNOSIS — R0689 Other abnormalities of breathing: Secondary | ICD-10-CM | POA: Insufficient documentation

## 2015-12-20 DIAGNOSIS — Y939 Activity, unspecified: Secondary | ICD-10-CM | POA: Insufficient documentation

## 2015-12-20 DIAGNOSIS — X58XXXA Exposure to other specified factors, initial encounter: Secondary | ICD-10-CM | POA: Insufficient documentation

## 2015-12-20 DIAGNOSIS — Y999 Unspecified external cause status: Secondary | ICD-10-CM | POA: Insufficient documentation

## 2015-12-20 DIAGNOSIS — Y92149 Unspecified place in prison as the place of occurrence of the external cause: Secondary | ICD-10-CM | POA: Insufficient documentation

## 2015-12-20 LAB — CBC WITH DIFFERENTIAL/PLATELET
Basophils Absolute: 0 10*3/uL (ref 0–0.1)
Basophils Relative: 1 %
Eosinophils Absolute: 0 10*3/uL (ref 0–0.7)
Eosinophils Relative: 1 %
HCT: 44.2 % (ref 40.0–52.0)
HEMOGLOBIN: 14.7 g/dL (ref 13.0–18.0)
LYMPHS ABS: 1 10*3/uL (ref 1.0–3.6)
LYMPHS PCT: 16 %
MCH: 31.9 pg (ref 26.0–34.0)
MCHC: 33.3 g/dL (ref 32.0–36.0)
MCV: 95.7 fL (ref 80.0–100.0)
Monocytes Absolute: 0.5 10*3/uL (ref 0.2–1.0)
Monocytes Relative: 8 %
NEUTROS ABS: 4.5 10*3/uL (ref 1.4–6.5)
NEUTROS PCT: 74 %
Platelets: 207 10*3/uL (ref 150–440)
RBC: 4.62 MIL/uL (ref 4.40–5.90)
RDW: 13.9 % (ref 11.5–14.5)
WBC: 6 10*3/uL (ref 3.8–10.6)

## 2015-12-20 LAB — BASIC METABOLIC PANEL
Anion gap: 6 (ref 5–15)
BUN: 9 mg/dL (ref 6–20)
CHLORIDE: 108 mmol/L (ref 101–111)
CO2: 24 mmol/L (ref 22–32)
Calcium: 8.9 mg/dL (ref 8.9–10.3)
Creatinine, Ser: 0.78 mg/dL (ref 0.61–1.24)
GFR calc non Af Amer: >60 mL/min (ref >60)
Glucose, Bld: 82 mg/dL (ref 65–99)
POTASSIUM: 3.6 mmol/L (ref 3.5–5.1)
SODIUM: 138 mmol/L (ref 135–145)

## 2015-12-20 LAB — URINALYSIS COMPLETE WITH MICROSCOPIC (ARMC ONLY)
Bilirubin Urine: NEGATIVE
GLUCOSE, UA: NEGATIVE mg/dL
Hgb urine dipstick: NEGATIVE
Nitrite: NEGATIVE
Protein, ur: NEGATIVE mg/dL
Specific Gravity, Urine: 1.016 (ref 1.005–1.030)
Squamous Epithelial / LPF: NONE SEEN
pH: 7 (ref 5.0–8.0)

## 2015-12-20 NOTE — ED Notes (Signed)
Patient transported to CT 

## 2015-12-20 NOTE — ED Triage Notes (Signed)
[facility] ED [facility].  Patient states he was upset that when he went to jail for a parole violation he was put in, "I block and put in a turtle suit.  I felt like they done me wrong so I swallowed the sides of my eye glasses.  Then they put on a liquid diet and I was mad so I put a rubber tooth brush handle in my penis."  Patient broke the brush part off and put the 4" handle inside his penis.  Patient reports burning to his penis and slight abdominal pain.  Patient is alert and oriented and is in no obvious distress at this time.  Patient denies SI and HI, states, "I just wanted to get out of there, I didn't like being in that part of the jail.  They done me wrong."

## 2015-12-20 NOTE — Discharge Instructions (Signed)
Patient continued drink. There is no toothbrush handle in the penis or bladder. The patient will need to follow-up with a gastroenterologist and should have serial x-rays as an outpatient to see part rest of the foreign bodies that are located in his stomach assumed to be the stems of his glasses. He should seek emergent care if he develops abdominal pain, bloody stool, or fever

## 2015-12-21 NOTE — ED Provider Notes (Addendum)
Time Seen: Approximately 1827  I have reviewed the triage notes  Chief Complaint: Foreign Body; Abdominal Pain; and Medical Clearance   History of Present Illness: Vincent Cruz is a 50 y.o. male patient presents from the [facility] jail with police department. He apparently swallowed the stems of his eyeglasses with attempt to change his location and gain attention from the staff. 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 The patient told the prison staff reinserted the toothbrush and broke off the handle of the toothbrush within his penis. He states he no longer feels it in his penis. He denies any other foreign bodies.   Past Medical History:  Diagnosis Date  . Arthritis   . Asthma   . Depression   . Heart murmur   . Self mutilating behavior     Patient Active Problem List   Diagnosis Date Noted  . Foreign body in digestive system 03/08/2015  . Borderline personality disorder 01/17/2015  . Cannabis abuse 01/17/2015  . Foreign body alimentary tract 01/17/2015  . 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 Foreign body in stomach   . Foreign body ingestion     Past Surgical History:  Procedure Laterality Date  . ABDOMINAL SURGERY    . ESOPHAGOGASTRODUODENOSCOPY N/A 03/08/2015   Procedure: ESOPHAGOGASTRODUODENOSCOPY (EGD);  Surgeon: Elnita Maxwell, MD;  Location: [facility] ENDOSCOPY;  Service: Endoscopy;  Laterality: N/A;  . FRACTURE SURGERY    . HERNIA REPAIR      Past Surgical History:  Procedure Laterality Date  . ABDOMINAL SURGERY    . ESOPHAGOGASTRODUODENOSCOPY N/A 03/08/2015   Procedure: ESOPHAGOGASTRODUODENOSCOPY (EGD);  Surgeon: Elnita Maxwell, MD;  Location: [facility] ENDOSCOPY;  Service: Endoscopy;  Laterality: N/A;  . FRACTURE SURGERY    . HERNIA REPAIR      Current Outpatient Rx  . Order #: [id number] Class: Historical Med    Allergies:  Propofol  Family History: No family history on file.  Social History: Social History   Substance Use Topics  . Smoking status: Current Every Day Smoker    Types: Cigarettes  . Smokeless tobacco: Never Used  . Alcohol use No     Review of Systems:   10 point review of systems was performed and was otherwise negative:  Constitutional: No fever Eyes: No visual disturbances ENT: No sore throat, ear pain Cardiac: No chest pain Respiratory: No shortness of breath, wheezing, or stridor Abdomen: No abdominal pain, no vomiting, No diarrhea Endocrine: No weight loss, No night sweats Extremities: No peripheral edema, cyanosis Skin: No rashes, easy bruising Neurologic: No focal weakness, trouble with speech or swollowing Urologic: No dysuria, Hematuria, or urinary frequency   Physical Exam:  ED Triage Vitals  Enc Vitals Group     BP 12/20/15 1514 113/77     Pulse Rate 12/20/15 1514 76     Resp 12/20/15 1514 16     Temp 12/20/15 1514 98.9 F (37.2 C)     Temp Source 12/20/15 1514 Oral     SpO2 12/20/15 1514 96 %     Weight 12/20/15 1515 130 lb (59 kg)     Height 12/20/15 1515 5\' 5"  (1.651 m)     Head Circumference --      Peak Flow --      Pain Score 12/20/15 1514 7     Pain Loc --      Pain Edu? --      Excl. in GC? --     General: Awake , Alert ,  and Oriented times 3; GCS 15 Head: Normal cephalic , atraumatic Eyes: Pupils equal , round, reactive to light Nose/Throat: No nasal drainage, patent upper airway without erythema or exudate.  Neck: Supple, Full range of motion, No anterior adenopathy or palpable thyroid masses Lungs: Clear to ascultation without wheezes , rhonchi, or rales Heart: Regular rate, regular rhythm without murmurs , gallops , or rubs Abdomen: Soft, non tender without rebound, guarding , or rigidity; bowel sounds positive and symmetric in all 4 quadrants. No organomegaly .        Extremities: 2 plus symmetric pulses. No edema, clubbing or cyanosis Neurologic: normal ambulation, Motor symmetric without deficits, sensory intact Skin: warm,  dry, no rashes Examination of his genitalia shows no obvious palpable foreign body within the penile shaft. There is no blood at the tip of the penile meatus Labs:   All laboratory work was reviewed including any pertinent negatives or positives listed below:  Labs Reviewed  URINALYSIS COMPLETEWITH MICROSCOPIC ([facility] ONLY) - Abnormal; Notable for the following:       Result Value   Color, Urine YELLOW (*)    APPearance CLEAR (*)    Ketones, ur 1+ (*)    Leukocytes, UA TRACE (*)    Bacteria, UA RARE (*)    All other components within normal limits  BASIC METABOLIC PANEL  CBC WITH DIFFERENTIAL/PLATELET   Radiology:  "Ct Abdomen Pelvis Wo Contrast  Result Date: 12/20/2015 CLINICAL DATA:  Foreign body in alimentary tract. EXAM: CT CHEST, ABDOMEN AND PELVIS WITHOUT CONTRAST TECHNIQUE: Multidetector CT imaging of the chest, abdomen and pelvis was performed following the standard protocol without IV contrast. COMPARISON:  Plain films 03/09/2015.  No prior CT. FINDINGS: CT CHEST FINDINGS Cardiovascular: Normal heart size, without pericardial effusion. Mediastinum/Nodes: No mediastinal or hilar adenopathy. Normal esophagus. No foreign body within. No pneumomediastinum. Lungs/Pleura: No pleural fluid.  Clear lungs. Musculoskeletal: No acute osseous abnormality. CT ABDOMEN PELVIS FINDINGS Hepatobiliary: Well-circumscribed hypo attenuating liver lesions are likely cysts. Normal gallbladder, without biliary ductal dilatation. Pancreas: Normal, without mass or ductal dilatation. Spleen: Normal in size, without focal abnormality. Adrenals/Urinary Tract: Normal adrenal glands. No renal calculi or hydronephrosis. No bladder calculi. Stomach/Bowel: Metallic foreign objects identified within the stomach, including on images 61 and 70/series 2. There are surgical sutures about the greater curvature of the stomach aswell. Normal appearance of the colon. Prior enterotomy. Vascular/Lymphatic: Aortic and branch vessel  atherosclerosis. No abdominopelvic adenopathy. Reproductive: Normal prostate. Other: No significant free fluid.  No free intraperitoneal air. Musculoskeletal: L5 pars defects with grade 1 L5-S1 anterolisthesis. Transitional S1 vertebral body. IMPRESSION: 1. Metallic foreign objects within the stomach. No free intraperitoneal air or obstruction. 2. Prior surgical changes about the stomach and small bowel. 3.  Aortic atherosclerosis. Electronically Signed   By: Jeronimo GreavesKyle  Talbot M.D.   On: 12/20/2015 19:40   Ct Chest Wo Contrast  Result Date: 12/20/2015 CLINICAL DATA:  Foreign body in alimentary tract. EXAM: CT CHEST, ABDOMEN AND PELVIS WITHOUT CONTRAST TECHNIQUE: Multidetector CT imaging of the chest, abdomen and pelvis was performed following the standard protocol without IV contrast. COMPARISON:  Plain films 03/09/2015.  No prior CT. FINDINGS: CT CHEST FINDINGS Cardiovascular: Normal heart size, without pericardial effusion. Mediastinum/Nodes: No mediastinal or hilar adenopathy. Normal esophagus. No foreign body within. No pneumomediastinum. Lungs/Pleura: No pleural fluid.  Clear lungs. Musculoskeletal: No acute osseous abnormality. CT ABDOMEN PELVIS FINDINGS Hepatobiliary: Well-circumscribed hypo attenuating liver lesions are likely cysts. Normal gallbladder, without biliary ductal dilatation. Pancreas: Normal, without mass  or ductal dilatation. Spleen: Normal in size, without focal abnormality. Adrenals/Urinary Tract: Normal adrenal glands. No renal calculi or hydronephrosis. No bladder calculi. Stomach/Bowel: Metallic foreign objects identified within the stomach, including on images 61 and 70/series 2. There are surgical sutures about the greater curvature of the stomach aswell. Normal appearance of the colon. Prior enterotomy. Vascular/Lymphatic: Aortic and branch vessel atherosclerosis. No abdominopelvic adenopathy. Reproductive: Normal prostate. Other: No significant free fluid.  No free intraperitoneal air.  Musculoskeletal: L5 pars defects with grade 1 L5-S1 anterolisthesis. Transitional S1 vertebral body. IMPRESSION: 1. Metallic foreign objects within the stomach. No free intraperitoneal air or obstruction. 2. Prior surgical changes about the stomach and small bowel. 3.  Aortic atherosclerosis. Electronically Signed   By: Jeronimo GreavesKyle  Talbot M.D.   On: 12/20/2015 19:40  "  I personally reviewed the radiologic studies    ED Course:  Bedside ultrasound was performed and no foreign body could be seen in the bladder. The patient appears to be able to empty his bladder. I felt we could do a whole body CAT scan to assess where the other foreign bodies may be. There is no finding of any tooth brush fragments seen within the pelvis region. The patient does have the identified foreign body which is likely this stems from his glasses located within the stomach. I reviewed these findings with the [facility] and we agree with outpatient management and the patient can eat and drink at this point. She advised that the patient could follow up locally with gastroenterology. He may require endoscopic exam of the foreign bodies to proceed through the alimentary tract. Patient currently exhibits no signs of abdominal pain, no melena or hematochezia. His lab work is stable. He was discharged back to the prison with the prison guards with instructions to follow up with the gastroenterologist. He should continue with his current diet Clinical Course     Assessment:  Foreign bodies in the stomach   Final Clinical Impression:   Final diagnoses:  Foreign body alimentary tract  Foreign body in stomach, initial encounter     Plan: * Outpatient Patient was advised to return immediately if condition worsens. Patient was advised to follow up with their primary care physician or other specialized physicians involved in their outpatient care. The patient and/or family member/power of attorney had  laboratory results reviewed at the bedside. All questions and concerns were addressed and appropriate discharge instructions were distributed by the nursing staff.            Jennye MoccasinBrian S Quigley, MD 12/21/15 [id number]    Jennye MoccasinBrian S Quigley, MD 12/21/15 [phone]

## 2016-03-15 ENCOUNTER — Emergency Department: Payer: Self-pay

## 2016-03-15 ENCOUNTER — Emergency Department
Admission: EM | Admit: 2016-03-15 | Discharge: 2016-03-15 | Disposition: A | Discharge status: Home / Self Care | Payer: Self-pay | Attending: Emergency Medicine | Admitting: Emergency Medicine

## 2016-03-15 ENCOUNTER — Encounter: Payer: Self-pay | Admitting: Emergency Medicine

## 2016-03-15 DIAGNOSIS — R0602 Shortness of breath: Secondary | ICD-10-CM | POA: Insufficient documentation

## 2016-03-15 DIAGNOSIS — F17210 Nicotine dependence, cigarettes, uncomplicated: Secondary | ICD-10-CM | POA: Insufficient documentation

## 2016-03-15 DIAGNOSIS — J45909 Unspecified asthma, uncomplicated: Secondary | ICD-10-CM | POA: Insufficient documentation

## 2016-03-15 DIAGNOSIS — R079 Chest pain, unspecified: Secondary | ICD-10-CM

## 2016-03-15 DIAGNOSIS — R110 Nausea: Secondary | ICD-10-CM | POA: Insufficient documentation

## 2016-03-15 DIAGNOSIS — R0789 Other chest pain: Secondary | ICD-10-CM | POA: Insufficient documentation

## 2016-03-15 HISTORY — DX: Anxiety disorder, unspecified: F41.9

## 2016-03-15 LAB — CBC
HCT: 43.7 % (ref 40.0–52.0)
HEMOGLOBIN: 15.3 g/dL (ref 13.0–18.0)
MCH: 32.1 pg (ref 26.0–34.0)
MCHC: 34.9 g/dL (ref 32.0–36.0)
MCV: 91.7 fL (ref 80.0–100.0)
PLATELETS: 266 10*3/uL (ref 150–440)
RBC: 4.76 MIL/uL (ref 4.40–5.90)
RDW: 13.5 % (ref 11.5–14.5)
WBC: 6.7 10*3/uL (ref 3.8–10.6)

## 2016-03-15 LAB — BASIC METABOLIC PANEL
ANION GAP: 6 (ref 5–15)
BUN: 16 mg/dL (ref 6–20)
CHLORIDE: 105 mmol/L (ref 101–111)
CO2: 27 mmol/L (ref 22–32)
Calcium: 10.1 mg/dL (ref 8.9–10.3)
Creatinine, Ser: 1.13 mg/dL (ref 0.61–1.24)
GFR calc Af Amer: >60 mL/min (ref >60)
Glucose, Bld: 91 mg/dL (ref 65–99)
POTASSIUM: 4.1 mmol/L (ref 3.5–5.1)
SODIUM: 138 mmol/L (ref 135–145)

## 2016-03-15 LAB — TROPONIN I

## 2016-03-15 MED ORDER — SULFAMETHOXAZOLE-TRIMETHOPRIM 800-160 MG PO TABS: 1.0000 | ORAL_TABLET | Freq: Two times a day (BID) | ORAL | 0 refills | Status: AC

## 2016-03-15 MED ORDER — ASPIRIN 81 MG PO CHEW
324.0000 mg | CHEWABLE_TABLET | Freq: Once | ORAL | Status: AC
  Administered 2016-03-15: 324 mg via ORAL
  Filled 2016-03-15: qty 4

## 2016-03-15 MED ORDER — LORAZEPAM 1 MG PO TABS
1.0000 mg | ORAL_TABLET | Freq: Once | ORAL | Status: AC
  Administered 2016-03-15: 1 mg via ORAL
  Filled 2016-03-15: qty 1

## 2016-03-15 NOTE — ED Provider Notes (Signed)
Time Seen: Approximately 1537  I have reviewed the triage notes  Chief Complaint: Chest Pain   History of Present Illness: Vincent Cruz is a 50 y.o. male who states he was walking to the homeless shelter and just was recently [facility] from prison. He states he became very anxious and felt some chest tightness, shortness of breath and brief nausea. He states he's had an upper respiratory infection and is been taking some Mucinex which he started yesterday. Patient denies much in way of chest discomfort at this time. 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 He has had history of foreign body ingestions and has had recurrent surgery on his abdomen. He denies any ingestion at this time. He has history of borderline personality disorder and denies any hallucinations, homicidal thoughts or suicidal thoughts at this time.   Past Medical History:  Diagnosis Date  . Anxiety   . Arthritis   . Asthma   . Depression   . Heart murmur   . Self mutilating behavior     Patient Active Problem List   Diagnosis Date Noted  . Foreign body in digestive system 03/08/2015  . Borderline personality disorder 01/17/2015  . Cannabis abuse 01/17/2015  . Foreign body alimentary tract 01/17/2015  . 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 Foreign body in stomach   . Foreign body ingestion     Past Surgical History:  Procedure Laterality Date  . ABDOMINAL SURGERY    . ESOPHAGOGASTRODUODENOSCOPY N/A 03/08/2015   Procedure: ESOPHAGOGASTRODUODENOSCOPY (EGD);  Surgeon: Elnita MaxwellMatthew Gordon Rein, MD;  [facility];  Service: Endoscopy;  Laterality: N/A;  . FRACTURE SURGERY    . HERNIA REPAIR      Past Surgical History:  Procedure Laterality Date  . ABDOMINAL SURGERY    . ESOPHAGOGASTRODUODENOSCOPY N/A 03/08/2015   Procedure: ESOPHAGOGASTRODUODENOSCOPY (EGD);  Surgeon: Elnita MaxwellMatthew Gordon Rein, MD;  [facility];  Service: Endoscopy;  Laterality: N/A;  . FRACTURE  SURGERY    . HERNIA REPAIR      Current Outpatient Rx  . Order #: [id number] Class: Historical Med  . Order #: [id number] Class: Print    Allergies:  Propofol  Family History: No family history on file.  Social History: Social History  Substance Use Topics  . Smoking status: Current Every Day Smoker    Packs/day: 1.00    Types: Cigarettes  . Smokeless tobacco: Never Used  . Alcohol use Yes     Comment: social drinker     Review of Systems:   10 point review of systems was performed and was otherwise negative:  Constitutional: No fever Eyes: No visual disturbances ENT: No sore throat, ear pain Cardiac: No Current chest pain. He states the pain and he did have was a pressure pain without radiation to the arm, jaw, neck region. Respiratory: No shortness of breath, wheezing, or stridor Abdomen: No abdominal pain, no vomiting, No diarrhea Endocrine: No weight loss, No night sweats Extremities: No peripheral edema, cyanosis Skin: No rashes, easy bruising Neurologic: No focal weakness, trouble with speech or swollowing Urologic: No dysuria, Hematuria, or urinary frequency   Physical Exam:  ED Triage Vitals  Enc Vitals Group     BP 03/15/16 1426 127/83     Pulse Rate 03/15/16 1426 87     Resp 03/15/16 1426 20     Temp 03/15/16 1426 98.1 F (36.7 C)     Temp Source 03/15/16 1426 Oral     SpO2 03/15/16 1426 97 %  Weight 03/15/16 1427 125 lb (56.7 kg)     Height 03/15/16 1427 5\' 5"  (1.651 m)     Head Circumference --      Peak Flow --      Pain Score 03/15/16 1427 4     Pain Loc --      Pain Edu? --      Excl. in GC? --     General: Awake , Alert , and Oriented times 3; GCS 15 Somewhat anxious Head: Normal cephalic , atraumatic Eyes: Pupils equal , round, reactive to light Nose/Throat: No nasal drainage, patent upper airway without erythema or exudate.  Neck: Supple, Full range of motion, No anterior adenopathy or palpable thyroid masses Lungs: Clear to  ascultation without wheezes , rhonchi, or rales Heart: Regular rate, regular rhythm without murmurs , gallops , or rubs Abdomen:Old well-healed well aligned surgical scars Soft, non tender without rebound, guarding , or rigidity; bowel sounds positive and symmetric in all 4 quadrants. No organomegaly .        Extremities: 2 plus symmetric pulses. No edema, clubbing or cyanosis Neurologic: normal ambulation, Motor symmetric without deficits, sensory intact Skin: Examination of his left buttock region shows a very small red raised area that does not have any fluctuance.   Labs:   All laboratory work was reviewed including any pertinent negatives or positives listed below:  Labs Reviewed  CBC  BASIC METABOLIC PANEL  TROPONIN I  TROPONIN I  Laboratory work was reviewed and showed no clinically significant abnormalities.   EKG:  ED ECG REPORT I, Jennye Moccasin, the attending physician, personally viewed and interpreted this ECG.  Date: 03/15/2016 EKG Time: 1419 Rate: 93 Rhythm: normal sinus rhythm QRS Axis: normal Intervals: normal ST/T Wave abnormalities: normal Conduction Disturbances: none Narrative Interpretation: unremarkable    Radiology:  "Dg Chest 2 View  Result Date: 03/15/2016 CLINICAL DATA:  Chest tightness with shortness of breath nausea beginning this morning current smoker. History of asthma EXAM: CHEST  2 VIEW COMPARISON:  Chest x-ray of March 08, 2015 FINDINGS: The lungs are mildly hyperinflated and clear. The heart and pulmonary vascularity are normal. The mediastinum is normal in width. There is no pleural effusion. There is a 6 mm diameter soft tissue nodule that projects at the level of the left ninth rib. The bony thorax exhibits no acute abnormality. IMPRESSION: Hyperinflation consistent with COPD.  No pneumonia nor CHF. 6 mm diameter soft tissue density nodule on the left may reflect a nipple shadow. A repeat PA chest x-ray with nipple markers is  recommended. Electronically Signed   By: David  [place] M.D.   On: 03/15/2016 15:32  "  I personally reviewed the radiologic studies    ED Course: * Patient's stay here was uneventful and I felt given his current clinical presentation and objective findings this is most likely anxiety related and unlikely to be cardiovascular in nature. He does have some remote history in his family for heart disease below has a normal-appearing EKG and serial troponins were negative. No significant pulmonary emboli risk factors. Patient freely admits she felt anxious after being [facility] from the prison. Clinical Course      Assessment:  Acute unspecified chest pain Small skin abscess   Final Clinical Impression:  Final diagnoses:  Nonspecific chest pain     Plan:  Outpatient management Patient was referred to general medicine and cardiology He was prescribed Bactrim for his history of MRSA, bed was advised that this lesion was not  significant that I felt he needed to start the antibiotic right away. He was advised to return here if he has any changes from his recent chest discomfort evaluation and advised his workup is not complete and he is to follow up as an outpatient. Patient appears to be of understanding and even know he has history of borderline personality disorder appears to be stable at this time and cooperative.            Jennye MoccasinBrian S Quigley, MD 03/15/16 2036

## 2016-03-15 NOTE — ED Triage Notes (Signed)
Pt in via EMS; pt was walking to homeless shelter, began to feel chest tightness with some sob, and nausea.  Pt states, "I feel like I have phlegm in my chest that I can't get up."  Pt reports getting mucinex yesterday but just began taking it this morning.  Pt A/Ox4, vitals WDL, no immediate distress at this time.

## 2016-03-15 NOTE — Discharge Instructions (Signed)
Please take the antibiotic especially if he has increased pain, redness or swelling. Please continue with follow-up with general medicine. Your chest discomfort may be coming from anxiety, unlikely to be acute coronary artery disease at this time. Please return to the emergency department if he have any changes in your symptoms.  Please return immediately if condition worsens. Please contact her primary physician or the physician you were given for referral. If you have any specialist physicians involved in her treatment and plan please also contact them. Thank you for using [place] [facility].

## 2016-03-17 ENCOUNTER — Emergency Department
Admission: EM | Admit: 2016-03-17 | Discharge: 2016-03-17 | Disposition: A | Discharge status: Home / Self Care | Payer: Self-pay | Attending: Student in an Organized Health Care Education/Training Program | Admitting: Student in an Organized Health Care Education/Training Program

## 2016-03-17 ENCOUNTER — Emergency Department: Payer: Self-pay

## 2016-03-17 ENCOUNTER — Encounter: Payer: Self-pay | Admitting: Emergency Medicine

## 2016-03-17 DIAGNOSIS — J45909 Unspecified asthma, uncomplicated: Secondary | ICD-10-CM | POA: Insufficient documentation

## 2016-03-17 DIAGNOSIS — Z79899 Other long term (current) drug therapy: Secondary | ICD-10-CM | POA: Insufficient documentation

## 2016-03-17 DIAGNOSIS — R0602 Shortness of breath: Secondary | ICD-10-CM | POA: Insufficient documentation

## 2016-03-17 DIAGNOSIS — Z791 Long term (current) use of non-steroidal anti-inflammatories (NSAID): Secondary | ICD-10-CM | POA: Insufficient documentation

## 2016-03-17 DIAGNOSIS — R0789 Other chest pain: Secondary | ICD-10-CM | POA: Insufficient documentation

## 2016-03-17 DIAGNOSIS — F17210 Nicotine dependence, cigarettes, uncomplicated: Secondary | ICD-10-CM | POA: Insufficient documentation

## 2016-03-17 DIAGNOSIS — R079 Chest pain, unspecified: Secondary | ICD-10-CM

## 2016-03-17 LAB — CBC WITH DIFFERENTIAL/PLATELET
Basophils Absolute: 0 10*3/uL (ref 0–0.1)
Basophils Relative: 1 %
EOS ABS: 0.3 10*3/uL (ref 0–0.7)
Eosinophils Relative: 5 %
HCT: 47.2 % (ref 40.0–52.0)
HEMOGLOBIN: 16.2 g/dL (ref 13.0–18.0)
LYMPHS ABS: 1.3 10*3/uL (ref 1.0–3.6)
Lymphocytes Relative: 23 %
MCH: 31.7 pg (ref 26.0–34.0)
MCHC: 34.3 g/dL (ref 32.0–36.0)
MCV: 92.2 fL (ref 80.0–100.0)
MONO ABS: 0.6 10*3/uL (ref 0.2–1.0)
MONOS PCT: 10 %
NEUTROS PCT: 61 %
Neutro Abs: 3.5 10*3/uL (ref 1.4–6.5)
Platelets: 297 10*3/uL (ref 150–440)
RBC: 5.12 MIL/uL (ref 4.40–5.90)
RDW: 13.3 % (ref 11.5–14.5)
WBC: 5.6 10*3/uL (ref 3.8–10.6)

## 2016-03-17 LAB — COMPREHENSIVE METABOLIC PANEL
ALK PHOS: 64 U/L (ref 38–126)
ALT: 17 U/L (ref 17–63)
ANION GAP: 9 (ref 5–15)
AST: 31 U/L (ref 15–41)
Albumin: 4.5 g/dL (ref 3.5–5.0)
BILIRUBIN TOTAL: 0.4 mg/dL (ref 0.3–1.2)
BUN: 10 mg/dL (ref 6–20)
CALCIUM: 9.5 mg/dL (ref 8.9–10.3)
CO2: 25 mmol/L (ref 22–32)
Chloride: 104 mmol/L (ref 101–111)
Creatinine, Ser: 1.22 mg/dL (ref 0.61–1.24)
GFR calc non Af Amer: >60 mL/min (ref >60)
Glucose, Bld: 97 mg/dL (ref 65–99)
Potassium: 3.6 mmol/L (ref 3.5–5.1)
SODIUM: 138 mmol/L (ref 135–145)
TOTAL PROTEIN: 7.6 g/dL (ref 6.5–8.1)

## 2016-03-17 LAB — TROPONIN I: Troponin I: <0.03 ng/mL (ref <0.03)

## 2016-03-17 MED ORDER — IPRATROPIUM-ALBUTEROL 0.5-2.5 (3) MG/3ML IN SOLN
3.0000 mL | Freq: Once | RESPIRATORY_TRACT | Status: AC
  Administered 2016-03-17: 3 mL via RESPIRATORY_TRACT
  Filled 2016-03-17: qty 3

## 2016-03-17 MED ORDER — LORAZEPAM 1 MG PO TABS
1.0000 mg | ORAL_TABLET | Freq: Once | ORAL | Status: AC
  Administered 2016-03-17: 1 mg via ORAL
  Filled 2016-03-17: qty 1

## 2016-03-17 NOTE — ED Provider Notes (Signed)
[facility] Emergency Department Provider Note    None    (approximate)  I have reviewed the triage vital signs and the nursing notes.   HISTORY  Chief Complaint Chest Pain    HPI Vincent Dickaul Boven is a 50 y.o. male with report of chest pain and pressure that occurred [place] [facility] [place]. Patient well-known to this facility. States that episode is similar to his previous episode with a tightness and shortness of breath. Currently denies any nausea. He currently denies any chest pressure or pain. States he has been having dry "smoker's cough". 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 He currently denies any suicidal thoughts or homicidal thoughts. Denies any foreign body ingestion.   Past Medical History:  Diagnosis Date  . Anxiety   . Arthritis   . Asthma   . Depression   . Heart murmur   . Self mutilating behavior    History reviewed. No pertinent family history. Past Surgical History:  Procedure Laterality Date  . ABDOMINAL SURGERY    . ESOPHAGOGASTRODUODENOSCOPY N/A 03/08/2015   Procedure: ESOPHAGOGASTRODUODENOSCOPY (EGD);  Surgeon: Elnita MaxwellMatthew Gordon Rein, MD;  [facility];  Service: Endoscopy;  Laterality: N/A;  . FRACTURE SURGERY    . HERNIA REPAIR     Patient Active Problem List   Diagnosis Date Noted  . Foreign body in digestive system 03/08/2015  . Borderline personality disorder 01/17/2015  . Cannabis abuse 01/17/2015  . Foreign body alimentary tract 01/17/2015  . 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 Foreign body in stomach   . Foreign body ingestion       Prior to Admission medications   Medication Sig Start Date End Date Taking? Authorizing Provider  albuterol (PROVENTIL HFA;VENTOLIN HFA) 108 (90 Base) MCG/ACT inhaler Inhale 1-2 puffs into the lungs every 6 (six) hours as needed for wheezing or shortness of breath.   Yes Historical  Provider, MD  buPROPion (WELLBUTRIN SR) 200 MG 12 hr tablet Take 200 mg by mouth 2 (two) times daily.   Yes Historical Provider, MD  sulfamethoxazole-trimethoprim (BACTRIM DS,SEPTRA DS) 800-160 MG tablet Take 1 tablet by mouth 2 (two) times daily. 03/15/16 03/25/16  Jennye MoccasinBrian S Quigley, MD    Allergies Patient has no active allergies.    Social History Social History  Substance Use Topics  . Smoking status: Current Every Day Smoker    Packs/day: 1.00    Types: Cigarettes  . Smokeless tobacco: Never Used  . Alcohol use Yes     Comment: social drinker    Review of Systems Patient denies headaches, rhinorrhea, blurry vision, numbness, shortness of breath, chest pain, edema, cough, abdominal pain, nausea, vomiting, diarrhea, dysuria, fevers, rashes or hallucinations unless otherwise stated above in HPI. ____________________________________________   PHYSICAL EXAM:  VITAL SIGNS: Vitals:   03/17/16 0855  BP: 115/66  Pulse: 88  Resp: 18  Temp: 98.3 F (36.8 C)    Constitutional: Alert and oriented. Well appearing and in no acute distress. Eyes: Conjunctivae are normal. PERRL. EOMI. Head: Atraumatic. Nose: No congestion/rhinnorhea. Mouth/Throat: Mucous membranes are moist.  Oropharynx non-erythematous. Neck: No stridor. Painless ROM. No cervical spine tenderness to palpation Hematological/Lymphatic/Immunilogical: No cervical lymphadenopathy. Cardiovascular: Normal rate, regular rhythm. Grossly normal heart sounds.  Good peripheral circulation. Respiratory: Normal respiratory effort.  No retractions. Lungs CTAB. Gastrointestinal: Soft and nontender. No distention. No abdominal bruits. No CVA tenderness. Genitourinary:  Musculoskeletal: No lower extremity tenderness nor edema.  No joint effusions. Neurologic:  Normal speech and language. No gross focal neurologic deficits are appreciated. No gait instability. Skin:  Skin is warm, dry and intact. No rash noted. Psychiatric: Mood  and affect are normal. Speech and behavior are normal.  ____________________________________________   LABS (all labs ordered are listed, but only abnormal results are displayed)  Results for orders placed or performed during the hospital encounter of 03/17/16 (from the past 24 hour(s))  Troponin I     Status: None   Collection Time: 03/17/16  9:00 AM  Result Value Ref Range   Troponin I <0.03 <0.03 ng/mL  CBC with Differential/Platelet     Status: None   Collection Time: 03/17/16  9:00 AM  Result Value Ref Range   WBC 5.6 3.8 - 10.6 K/uL   RBC 5.12 4.40 - 5.90 MIL/uL   Hemoglobin 16.2 13.0 - 18.0 g/dL   HCT 16.147.2 09.640.0 - 04.552.0 %   MCV 92.2 80.0 - 100.0 fL   MCH 31.7 26.0 - 34.0 pg   MCHC 34.3 32.0 - 36.0 g/dL   RDW 40.913.3 81.111.5 - 91.414.5 %   Platelets 297 150 - 440 K/uL   Neutrophils Relative % 61 %   Neutro Abs 3.5 1.4 - 6.5 K/uL   Lymphocytes Relative 23 %   Lymphs Abs 1.3 1.0 - 3.6 K/uL   Monocytes Relative 10 %   Monocytes Absolute 0.6 0.2 - 1.0 K/uL   Eosinophils Relative 5 %   Eosinophils Absolute 0.3 0 - 0.7 K/uL   Basophils Relative 1 %   Basophils Absolute 0.0 0 - 0.1 K/uL  Comprehensive metabolic panel     Status: None   Collection Time: 03/17/16  9:00 AM  Result Value Ref Range   Sodium 138 135 - 145 mmol/L   Potassium 3.6 3.5 - 5.1 mmol/L   Chloride 104 101 - 111 mmol/L   CO2 25 22 - 32 mmol/L   Glucose, Bld 97 65 - 99 mg/dL   BUN 10 6 - 20 mg/dL   Creatinine, Ser 7.821.22 0.61 - 1.24 mg/dL   Calcium 9.5 8.9 - 95.610.3 mg/dL   Total Protein 7.6 6.5 - 8.1 g/dL   Albumin 4.5 3.5 - 5.0 g/dL   AST 31 15 - 41 U/L   ALT 17 17 - 63 U/L   Alkaline Phosphatase 64 38 - 126 U/L   Total Bilirubin 0.4 0.3 - 1.2 mg/dL   GFR calc non Af Amer >60 >60 mL/min   GFR calc Af Amer >60 >60 mL/min   Anion gap 9 5 - 15   ____________________________________________  EKG My review and personal interpretation at Time: 8:54   Indication: chest pain  Rate: 85  Rhythm: sinus Axis:  normal Other: no acute ischemic changes, normal intervals ____________________________________________  RADIOLOGY  I personally reviewed all radiographic images ordered to evaluate for the above acute complaints and reviewed radiology reports and findings.  These findings were personally discussed with the patient.  Please see medical record for radiology report. ____________________________________________   PROCEDURES  Procedure(s) performed: none Procedures    Critical Care performed: no ____________________________________________   INITIAL IMPRESSION / ASSESSMENT AND PLAN / ED COURSE  Pertinent labs & imaging results that were available during my care of the patient were reviewed by me and considered in my medical decision making (see chart for details).  DDX: ACS, pericarditis, esophagitis, boerhaaves, pe, dissection, pna, bronchitis, costochondritis   Vincent Cruz is a 50 y.o. who presents to the ED  with recurrent episode of chest pressure.Patient is AFVSS in ED. Exam as above. Given current presentation have considered the above differential.  EKG shows no acute ischemia. Troponin is negative. Chest x-ray is clear does have a component of wheezing suggesting some underlying component of bronchitis but no evidence of focal pneumonia. Patient has a low risk heart score. History is not consistent with ACS. Likely secondary to underlying anxiety.  Patient stable for outpatient follow up.  Have discussed with the patient and available family all diagnostics and treatments performed thus far and all questions were answered to the best of my ability. The patient demonstrates understanding and agreement with plan.   Clinical Course      ____________________________________________   FINAL CLINICAL IMPRESSION(S) / ED DIAGNOSES  Final diagnoses:  Chest pain, unspecified type      NEW MEDICATIONS STARTED DURING THIS VISIT:  New Prescriptions   No medications on file      Note:  This document was prepared using Dragon voice recognition software and may include unintentional dictation errors.    Willy Eddy, MD 03/17/16 [phone]

## 2016-03-17 NOTE — ED Triage Notes (Signed)
Pt arrived via EMS. [place] [facility] [place] and had a sudden onset of substernal chest pain while walking, with tingling to the left hand.  Pt initially described the pain as crushing but c/o pressure now.  Pain is at a 5/10.  Pt here 2 days ago with similar complaint. Etoh use last night.

## 2016-03-17 NOTE — ED Notes (Signed)
Patient transported to X-ray 

## 2016-03-20 ENCOUNTER — Emergency Department

## 2016-03-20 ENCOUNTER — Encounter: Payer: Self-pay | Admitting: Emergency Medicine

## 2016-03-20 ENCOUNTER — Emergency Department
Admission: EM | Admit: 2016-03-20 | Discharge: 2016-03-20 | Disposition: A | Discharge status: Home / Self Care | Attending: Emergency Medicine | Admitting: Emergency Medicine

## 2016-03-20 DIAGNOSIS — J45909 Unspecified asthma, uncomplicated: Secondary | ICD-10-CM | POA: Insufficient documentation

## 2016-03-20 DIAGNOSIS — F17210 Nicotine dependence, cigarettes, uncomplicated: Secondary | ICD-10-CM | POA: Insufficient documentation

## 2016-03-20 DIAGNOSIS — R05 Cough: Secondary | ICD-10-CM | POA: Insufficient documentation

## 2016-03-20 DIAGNOSIS — R059 Cough, unspecified: Secondary | ICD-10-CM

## 2016-03-20 DIAGNOSIS — Z79899 Other long term (current) drug therapy: Secondary | ICD-10-CM | POA: Insufficient documentation

## 2016-03-20 DIAGNOSIS — J069 Acute upper respiratory infection, unspecified: Secondary | ICD-10-CM | POA: Insufficient documentation

## 2016-03-20 LAB — BASIC METABOLIC PANEL
Anion gap: 7 (ref 5–15)
BUN: 13 mg/dL (ref 6–20)
CHLORIDE: 106 mmol/L (ref 101–111)
CO2: 26 mmol/L (ref 22–32)
CREATININE: 1.23 mg/dL (ref 0.61–1.24)
Calcium: 8.9 mg/dL (ref 8.9–10.3)
GFR calc Af Amer: >60 mL/min (ref >60)
GFR calc non Af Amer: >60 mL/min (ref >60)
GLUCOSE: 88 mg/dL (ref 65–99)
Potassium: 3.6 mmol/L (ref 3.5–5.1)
SODIUM: 139 mmol/L (ref 135–145)

## 2016-03-20 LAB — CBC
HEMATOCRIT: 41.2 % (ref 40.0–52.0)
HEMOGLOBIN: 14 g/dL (ref 13.0–18.0)
MCH: 31.7 pg (ref 26.0–34.0)
MCHC: 34 g/dL (ref 32.0–36.0)
MCV: 93.4 fL (ref 80.0–100.0)
Platelets: 223 10*3/uL (ref 150–440)
RBC: 4.41 MIL/uL (ref 4.40–5.90)
RDW: 13.1 % (ref 11.5–14.5)
WBC: 7.5 10*3/uL (ref 3.8–10.6)

## 2016-03-20 LAB — INFLUENZA PANEL BY PCR (TYPE A & B)
Influenza A By PCR: NEGATIVE
Influenza B By PCR: NEGATIVE

## 2016-03-20 LAB — TROPONIN I: Troponin I: <0.03 ng/mL (ref <0.03)

## 2016-03-20 MED ORDER — ACETAMINOPHEN 325 MG PO TABS
650.0000 mg | ORAL_TABLET | Freq: Once | ORAL | Status: AC
  Administered 2016-03-20: 650 mg via ORAL
  Filled 2016-03-20: qty 2

## 2016-03-20 MED ORDER — IPRATROPIUM-ALBUTEROL 0.5-2.5 (3) MG/3ML IN SOLN
3.0000 mL | Freq: Once | RESPIRATORY_TRACT | Status: AC
  Administered 2016-03-20: 3 mL via RESPIRATORY_TRACT
  Filled 2016-03-20: qty 3

## 2016-03-20 NOTE — ED Provider Notes (Signed)
[facility] Emergency Department Provider Note   ____________________________________________    I have reviewed the triage vital signs and the nursing notes.   HISTORY  Chief Complaint Cough     HPI Vincent Cruz is a 50 y.o. male who presents with complaints of a cough. Patient has been seen several times in last week. When questioned he admits that he is here because it is cold outside and he is trying to get out of the cold weather because he is homeless. No history of fever. No chest pain. He does report smoking heavily. Has no psychiatric complaints today.   Past Medical History:  Diagnosis Date  . Anxiety   . Arthritis   . Asthma   . Depression   . Heart murmur   . Self mutilating behavior     Patient Active Problem List   Diagnosis Date Noted  . Foreign body in digestive system 03/08/2015  . Borderline personality disorder 01/17/2015  . Cannabis abuse 01/17/2015  . Foreign body alimentary tract 01/17/2015  . 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 Foreign body in stomach   . Foreign body ingestion     Past Surgical History:  Procedure Laterality Date  . ABDOMINAL SURGERY    . ESOPHAGOGASTRODUODENOSCOPY N/A 03/08/2015   Procedure: ESOPHAGOGASTRODUODENOSCOPY (EGD);  Surgeon: Elnita MaxwellMatthew Gordon Rein, MD;  [facility];  Service: Endoscopy;  Laterality: N/A;  . FRACTURE SURGERY    . HERNIA REPAIR      Prior to Admission medications   Medication Sig Start Date End Date Taking? Authorizing Provider  albuterol (PROVENTIL HFA;VENTOLIN HFA) 108 (90 Base) MCG/ACT inhaler Inhale 1-2 puffs into the lungs every 6 (six) hours as needed for wheezing or shortness of breath.    Historical Provider, MD  buPROPion (WELLBUTRIN SR) 150 MG 12 hr tablet Take 150 mg by mouth 3 (three) times daily.    Historical Provider, MD  sulfamethoxazole-trimethoprim (BACTRIM DS,SEPTRA DS) 800-160 MG tablet Take 1 tablet by mouth 2 (two) times daily. 03/15/16 03/25/16   Jennye MoccasinBrian S Quigley, MD     Allergies Patient has no known allergies.  History reviewed. No pertinent family history.  Social History Social History  Substance Use Topics  . Smoking status: Current Every Day Smoker    Packs/day: 1.00    Types: Cigarettes  . Smokeless tobacco: Never Used  . Alcohol use Yes     Comment: social drinker    Review of Systems  Constitutional: No fever  ENT: No sore throat.   Gastrointestinal: No abdominal pain.   Musculoskeletal: Negative for calf pain or swelling Skin: Negative for rash.     ____________________________________________   PHYSICAL EXAM:  VITAL SIGNS: ED Triage Vitals  Enc Vitals Group     BP 03/20/16 0420 120/68     Pulse Rate 03/20/16 0420 (!) 113     Resp 03/20/16 0420 18     Temp 03/20/16 0420 97.5 F (36.4 C)     Temp Source 03/20/16 0420 Oral     SpO2 03/20/16 0420 97 %     Weight 03/20/16 0421 125 lb (56.7 kg)     Height 03/20/16 0421 5\' 5"  (1.651 m)     Head Circumference --      Peak Flow --      Pain Score 03/20/16 0421 6     Pain Loc --      Pain Edu? --      Excl. in GC? --     Constitutional: Alert and  oriented. No acute distress. Pleasant and interactive Eyes: Conjunctivae are normal.   Nose: No congestion/rhinnorhea. Mouth/Throat: Mucous membranes are moist.   Cardiovascular: Normal rate, regular rhythm. On my exam heart rate 92 Respiratory: Normal respiratory effort.  No retractions. Left lower lobe mild wheeze  Musculoskeletal: No lower extremity tenderness nor edema.   Neurologic:  Normal speech and language. No gross focal neurologic deficits are appreciated.   Skin:  Skin is warm, dry and intact. No rash noted.   ____________________________________________   LABS (all labs ordered are listed, but only abnormal results are displayed)  Labs Reviewed - No data to  display ____________________________________________  EKG   ____________________________________________  RADIOLOGY  None ____________________________________________   PROCEDURES  Procedure(s) performed: No    Critical Care performed: No ____________________________________________   INITIAL IMPRESSION / ASSESSMENT AND PLAN / ED COURSE  Pertinent labs & imaging results that were available during my care of the patient were reviewed by me and considered in my medical decision making (see chart for details).  Patient with normal labs and x-ray performed just a few days ago. Exam is benign, mild wheeze is noted above. We'll treat with DuoNeb. No indication for labs or x-rays today  ----------------------------------------- 4:56 AM on 03/20/2016 -----------------------------------------  Patient feels better after DuoNeb, lungs are clear, patient is well-appearing and appropriate for discharge ____________________________________________   FINAL CLINICAL IMPRESSION(S) / ED DIAGNOSES  Final diagnoses:  Cough      NEW MEDICATIONS STARTED DURING THIS VISIT:  New Prescriptions   No medications on file     Note:  This document was prepared using Dragon voice recognition software and may include unintentional dictation errors.    Jene Everyobert Jaystin Mcgarvey, MD 03/20/16 [phone]

## 2016-03-20 NOTE — Discharge Instructions (Signed)
You have been seen in the emergency department for cough and congestion. Her workup is most consistent with a viral upper respiratory infection. Please use Tylenol or Motrin every 6 hours as needed for fever or discomfort as written on the box. Drink plenty of fluids and obtain plain of rest.

## 2016-03-20 NOTE — ED Provider Notes (Signed)
[facility] Emergency Department Provider Note  Time seen: 7:08 PM  I have reviewed the triage vital signs and the nursing notes.   HISTORY  Chief Complaint Chest pain, cough, congestion   HPI Vincent Cruz is a 50 y.o. male with a past medical history of asthma, depression, cannabis use, who presents the emergency department with cough, congestion and chest pain. According to the patient for the past 3 days he has been coughing with sputum production experiencing chest discomfort especially with coughing. Today the patient states he was feeling chills along with this cough so he came to the emergency department for evaluation. Patient is febrile to 100.6 in the emergency department with a frequent cough. Denies any abdominal pain, nausea, vomiting, diarrhea. States he has been getting brown sputum up with his cough.  Past Medical History:  Diagnosis Date  . Anxiety   . Arthritis   . Asthma   . Depression   . Heart murmur   . Self mutilating behavior     Patient Active Problem List   Diagnosis Date Noted  . Foreign body in digestive system 03/08/2015  . Borderline personality disorder 01/17/2015  . Cannabis abuse 01/17/2015  . Foreign body alimentary tract 01/17/2015  . 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 Foreign body in stomach   . Foreign body ingestion     Past Surgical History:  Procedure Laterality Date  . ABDOMINAL SURGERY    . ESOPHAGOGASTRODUODENOSCOPY N/A 03/08/2015   Procedure: ESOPHAGOGASTRODUODENOSCOPY (EGD);  Surgeon: Elnita MaxwellMatthew Gordon Rein, MD;  [facility];  Service: Endoscopy;  Laterality: N/A;  . FRACTURE SURGERY    . HERNIA REPAIR      Prior to Admission medications   Medication Sig Start Date End Date Taking? Authorizing Provider  albuterol (PROVENTIL HFA;VENTOLIN HFA) 108 (90 Base) MCG/ACT inhaler Inhale 1-2 puffs into the lungs every 6 (six) hours as needed for wheezing or shortness of breath.    Historical Provider, MD   buPROPion (WELLBUTRIN SR) 150 MG 12 hr tablet Take 150 mg by mouth 3 (three) times daily.    Historical Provider, MD  sulfamethoxazole-trimethoprim (BACTRIM DS,SEPTRA DS) 800-160 MG tablet Take 1 tablet by mouth 2 (two) times daily. 03/15/16 03/25/16  Jennye MoccasinBrian S Quigley, MD    No Known Allergies  No family history on file.  Social History Social History  Substance Use Topics  . Smoking status: Current Every Day Smoker    Packs/day: 1.00    Types: Cigarettes  . Smokeless tobacco: Never Used  . Alcohol use Yes     Comment: social drinker    Review of Systems Constitutional: Positive for fever. Cardiovascular: Mild chest pain with cough. Respiratory: Mild shortness of breath. Positive for cough or sputum production. Gastrointestinal: Negative for abdominal pain Musculoskeletal: Negative for back pain Neurological: Negative for headache 10-point ROS otherwise negative.  ____________________________________________   PHYSICAL EXAM:  VITAL SIGNS: ED Triage Vitals [03/20/16 1901]  Enc Vitals Group     BP      Pulse      Resp      Temp (!) 100.6 F (38.1 C)     Temp Source Oral     SpO2      Weight      Height      Head Circumference      Peak Flow      Pain Score      Pain Loc      Pain Edu?      Excl. in GC?  Constitutional: Alert and oriented. Well appearing and in no distress. Eyes: Normal exam ENT   Head: Normocephalic and atraumatic   Mouth/Throat: Mucous membranes are moist. Cardiovascular: Normal rate, regular rhythm. No murmur Respiratory: Normal respiratory effort without tachypnea nor retractions. Breath sounds are clear  Gastrointestinal: Soft and nontender. No distention.  Musculoskeletal: Nontender with normal range of motion in all extremities.  Neurologic:  Normal speech and language. No gross focal neurologic deficits Skin:  Skin is warm, dry and intact.  Psychiatric: Mood and affect are normal.    ____________________________________________   RADIOLOGY  Chest x-ray negative  ____________________________________________   INITIAL IMPRESSION / ASSESSMENT AND PLAN / ED COURSE  Pertinent labs & imaging results that were available during my care of the patient were reviewed by me and considered in my medical decision making (see chart for details).  Patient presents emergency department with chest pain, cough, congestion. Patient is febrile to 100.6 in the emergency department. We'll check labs, chest x-ray, influenza and closer monitoring the emergency department. Suspect likely URI.  Patient's chest x-ray is negative. Labs are within normal limits including negative influenza. Negative troponin. Highly suspect viral URI. Patient will be discharged home with supportive care  ____________________________________________   FINAL CLINICAL IMPRESSION(S) / ED DIAGNOSES  Upper respiratory infection    Minna AntisKevin Ellysia Char, MD 03/20/16 2041

## 2016-03-20 NOTE — ED Notes (Signed)
MD Kinner at bedside  

## 2016-03-20 NOTE — ED Triage Notes (Signed)
Pt presents to ED with cough that has gotten progressively worse. Pt informed STAT desk that he is homeless and freezing outside. Pt states when he was seen in this ED yesterday for chest pain he had the same cough. No increased work of breathing or acute distress noted at this time.

## 2016-03-20 NOTE — ED Triage Notes (Signed)
Pt arrived via ems for c/o chest pain that radiates into left arm and hand - pain started earlier today - pt also has a productive cough for the last week - sputum is brown in color - pt is constantly clearing his throat - pt was given asa and nitro by ems with no results and caused a headache - pt has low grade temp

## 2016-03-20 NOTE — ED Notes (Signed)
Pt arrived via ems for c/o chest pain that radiates into left arm and hand - pain started earlier today - pt also has a productive cough for the last week - sputum is brown in color - pt is constantly clearing his throat - pt was given asa and nitro by ems with no results and caused a headache - pt has low grade temp - pt states that he smoked some pot last night that he thinks may have had K2 in it and he is afraid that this has caused the chest pain

## 2016-03-27 ENCOUNTER — Telehealth: Payer: Self-pay

## 2016-03-27 NOTE — Telephone Encounter (Signed)
Tried to call patient to make ED fu for CP  Pt did not answer he had no vm set up Called 2 times. Will try again later.

## 2016-04-04 NOTE — Telephone Encounter (Signed)
Tried to call patient to make ED fu for CP  Pt did not answer he had no vm set up Will try again later.

## 2017-03-21 IMAGING — CR DG CHEST 2V
2 series · 2 of 2 positions shown · non-contrast
Comparison: 03/15/2016

CLINICAL DATA: Acute substernal chest pain

EXAM:
CHEST  2 VIEW

[chest pa]
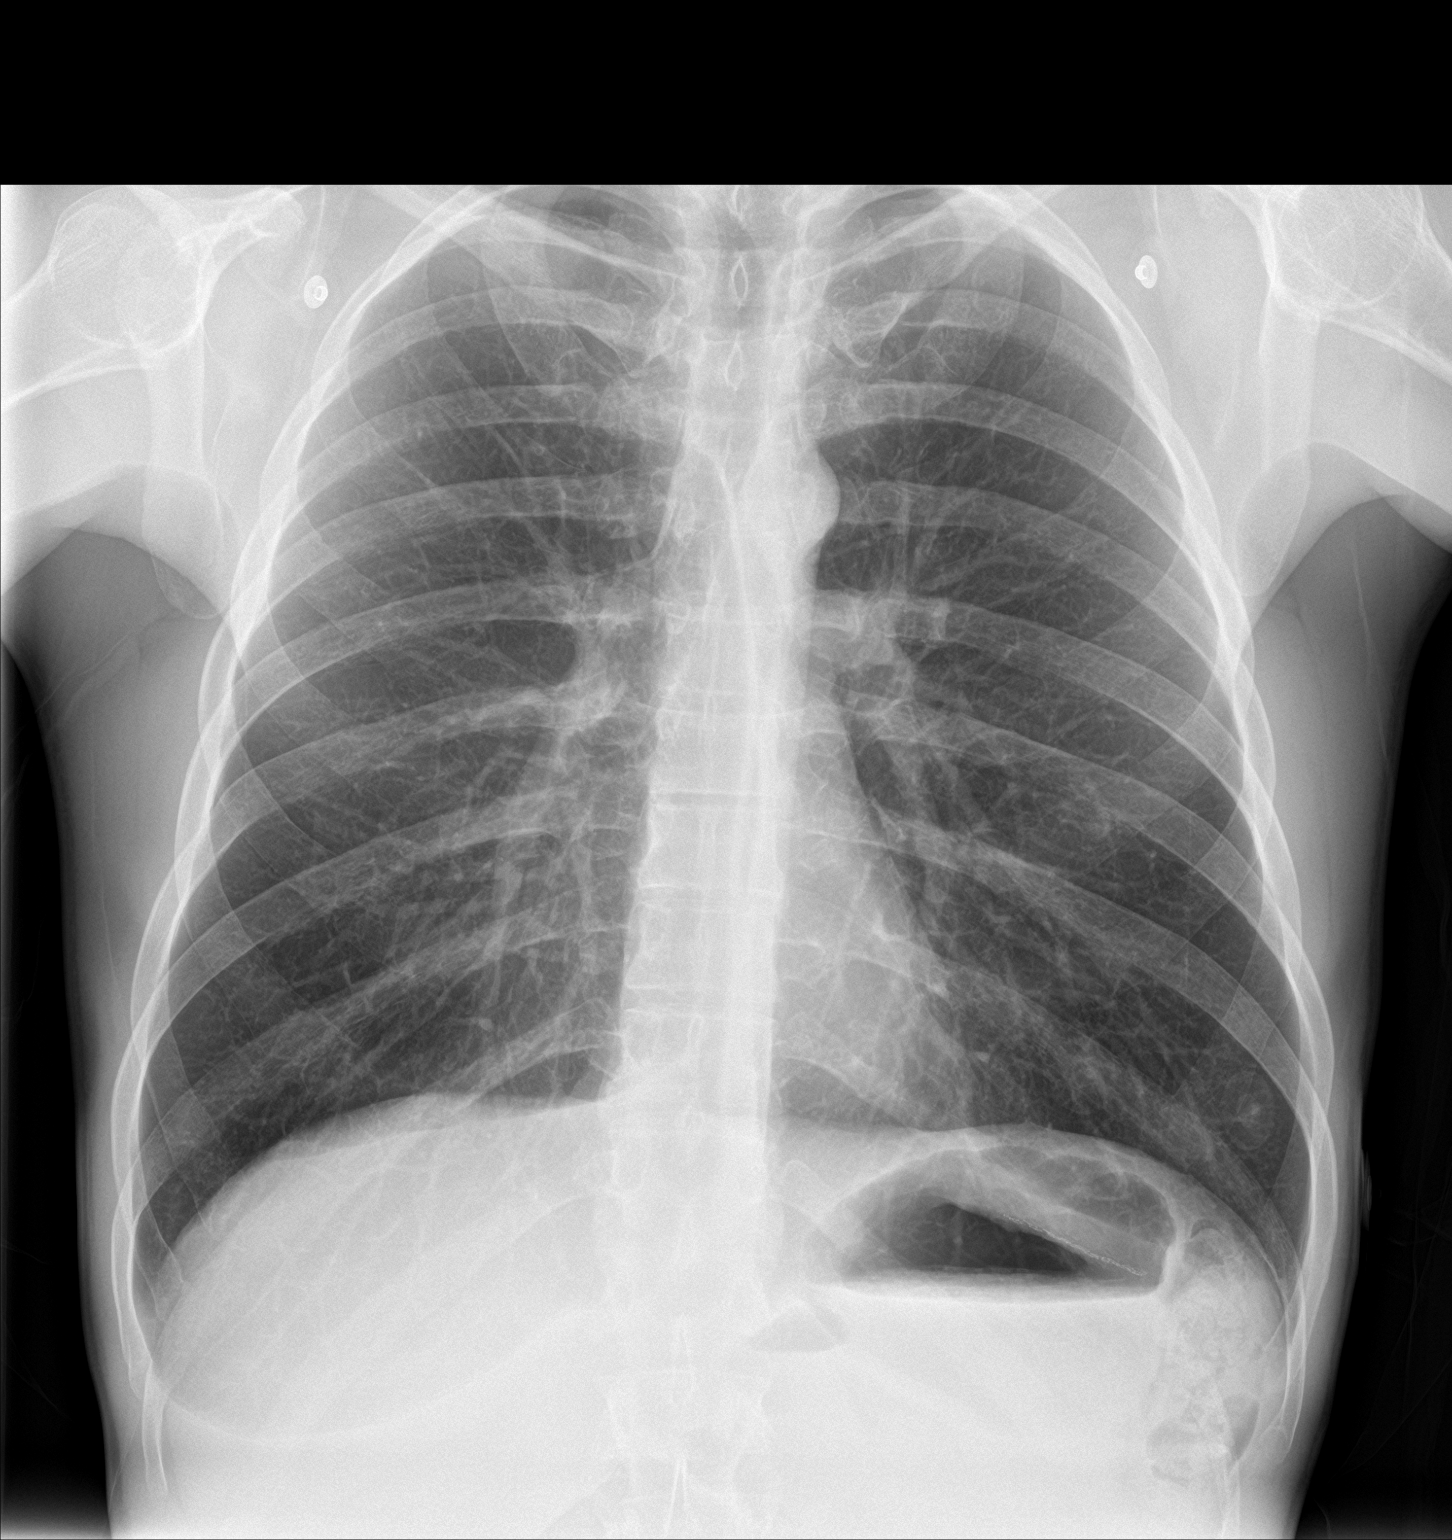

[chest lat]
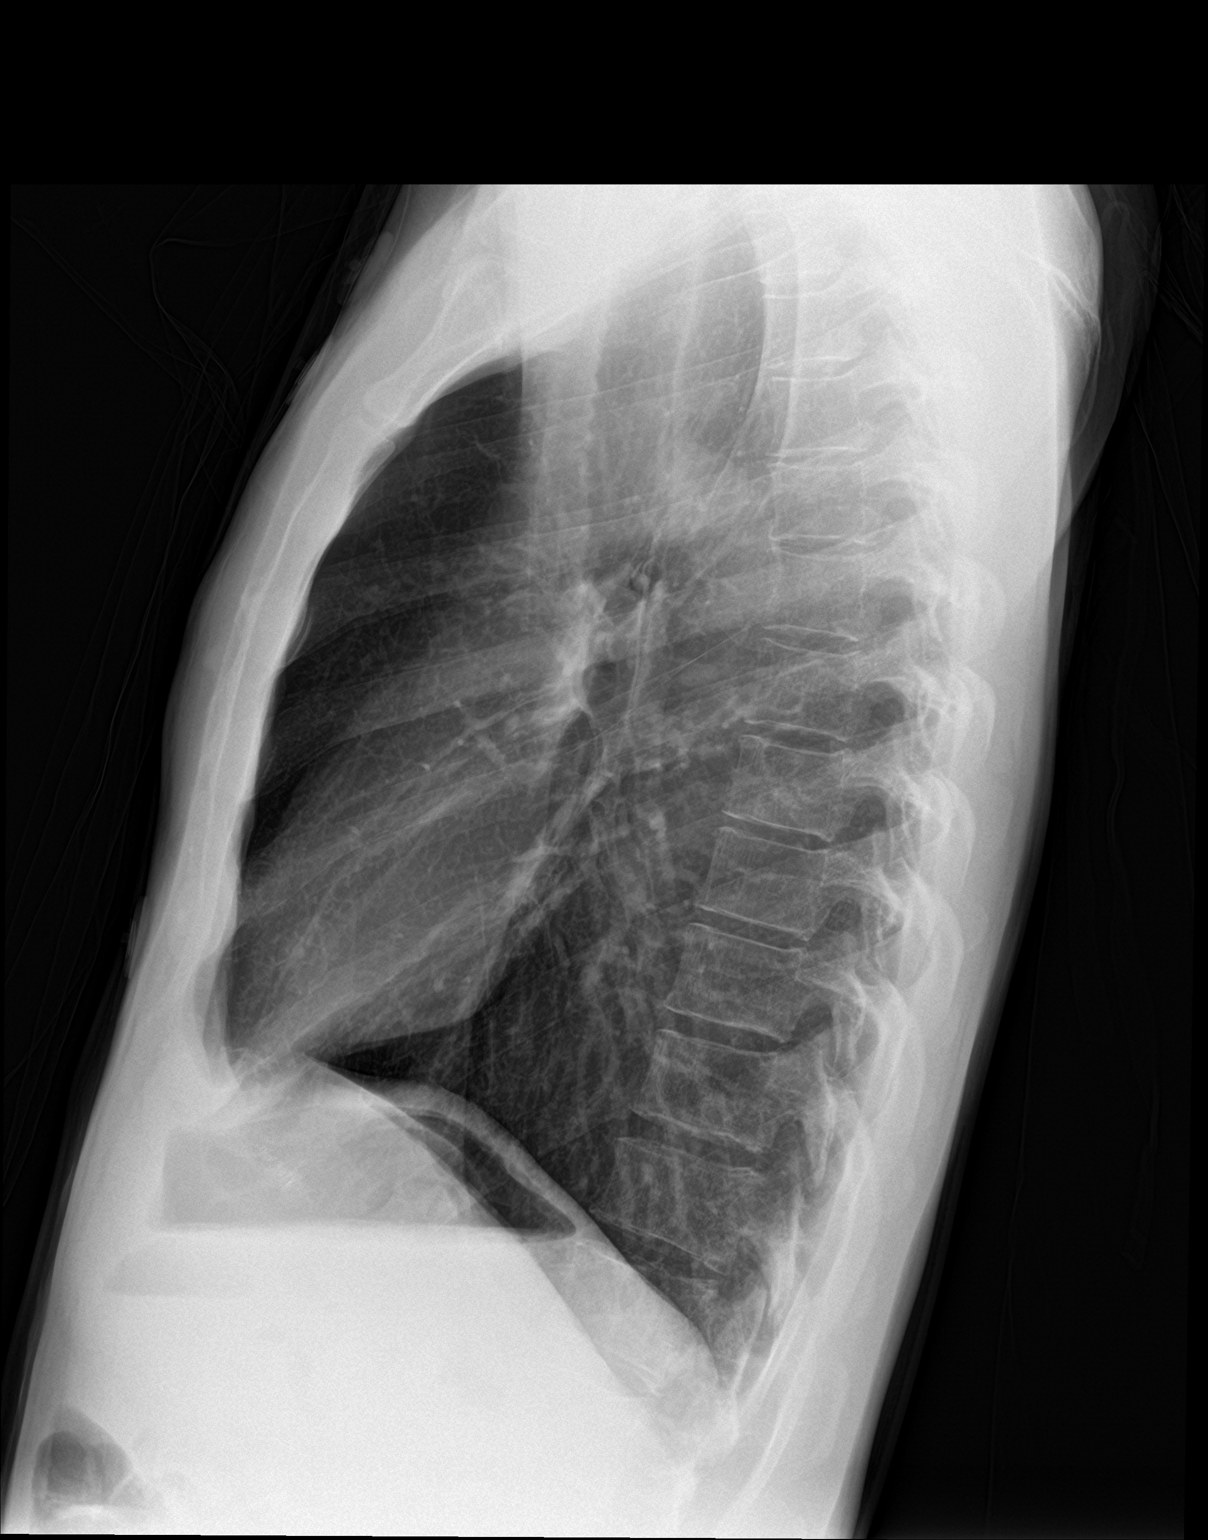

[2 of 2 positions shown; findings below may reference images not displayed]

FINDINGS: The heart size and mediastinal contours are within normal limits.
Both lungs are clear. The visualized skeletal structures are
unremarkable.
IMPRESSION: No active cardiopulmonary disease.

## 2017-03-24 IMAGING — CR DG CHEST 2V
2 series · 2 of 2 positions shown · non-contrast
Comparison: 03/17/2016

CLINICAL DATA: Productive cough and fever for 3-4 days. Cardiac
murmur.

EXAM:
CHEST  2 VIEW

[chest pa]
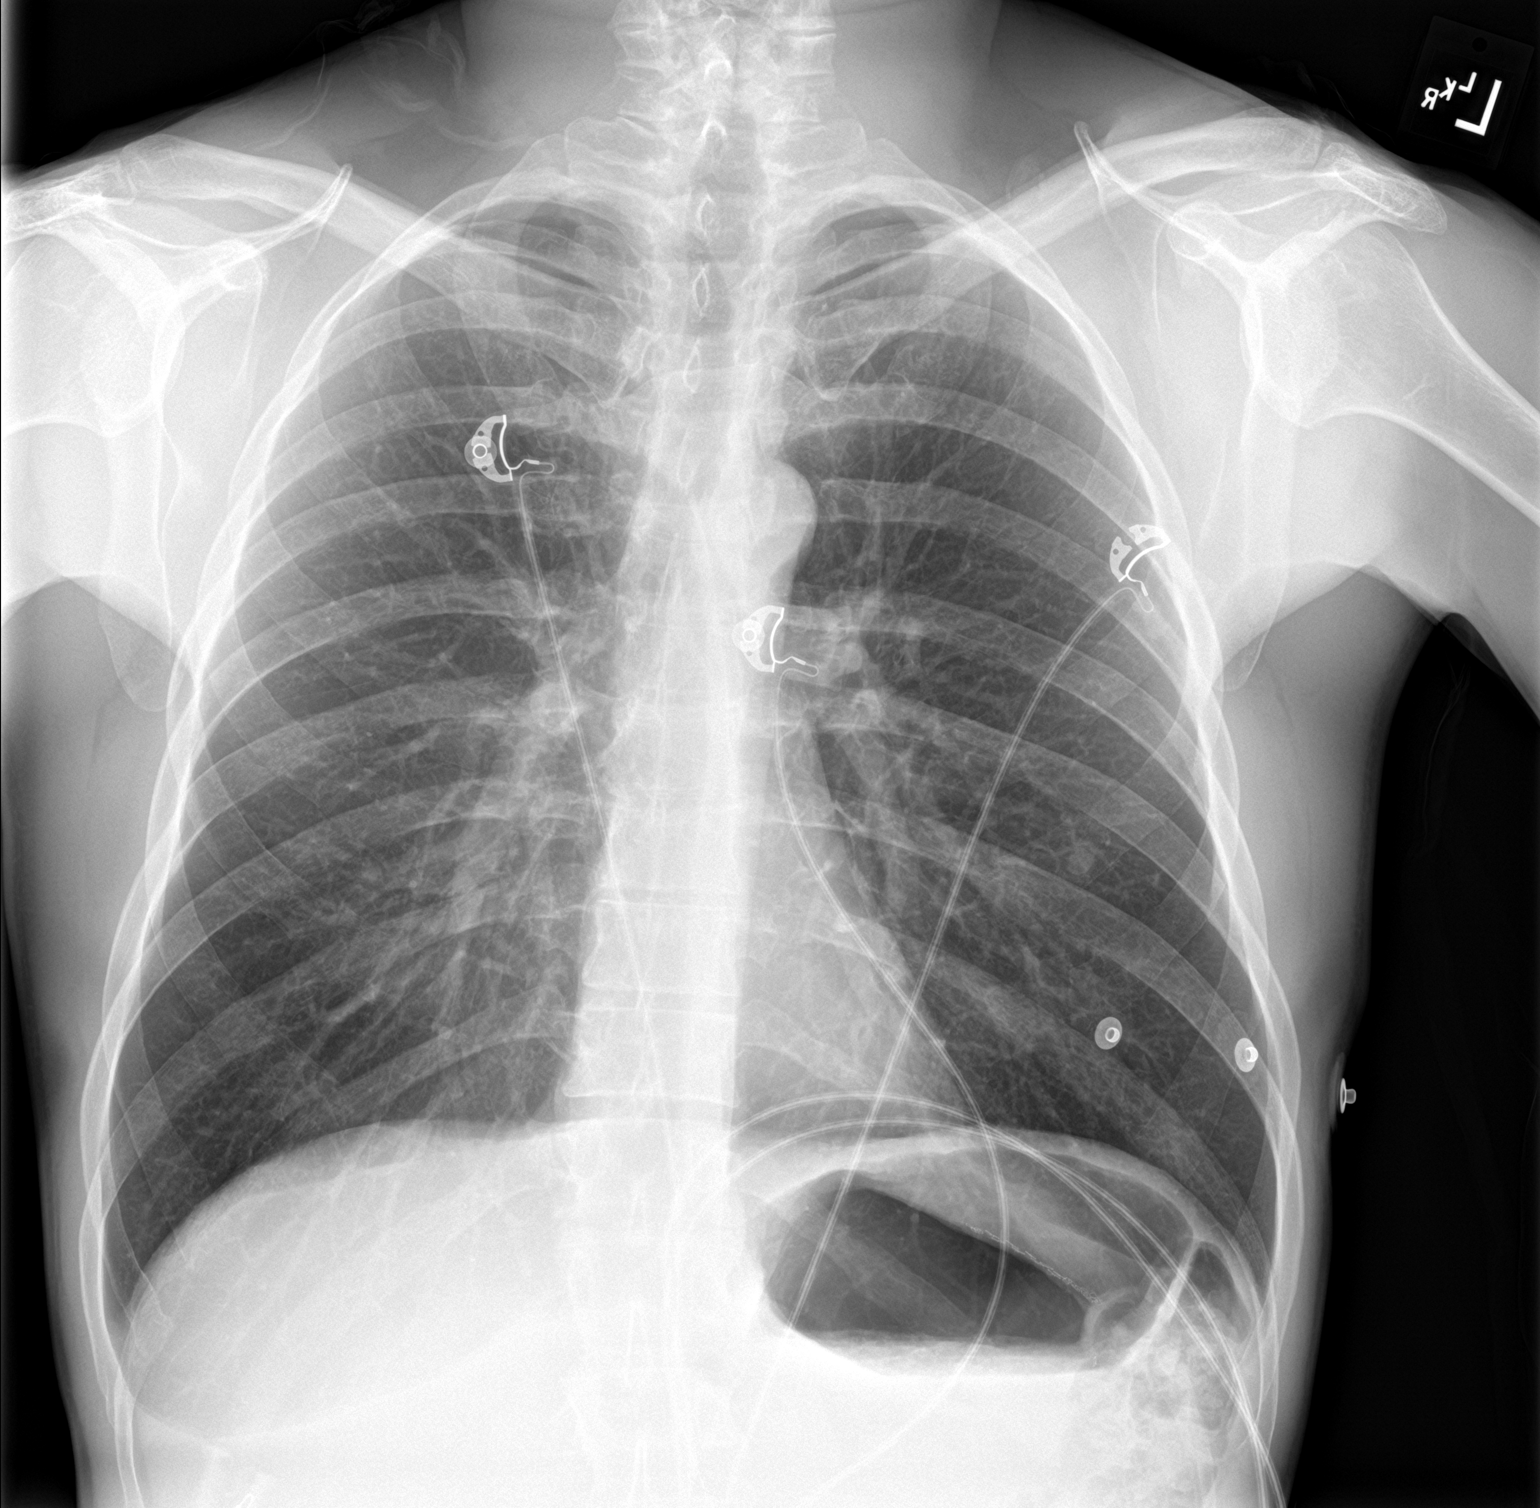

[chest lat]
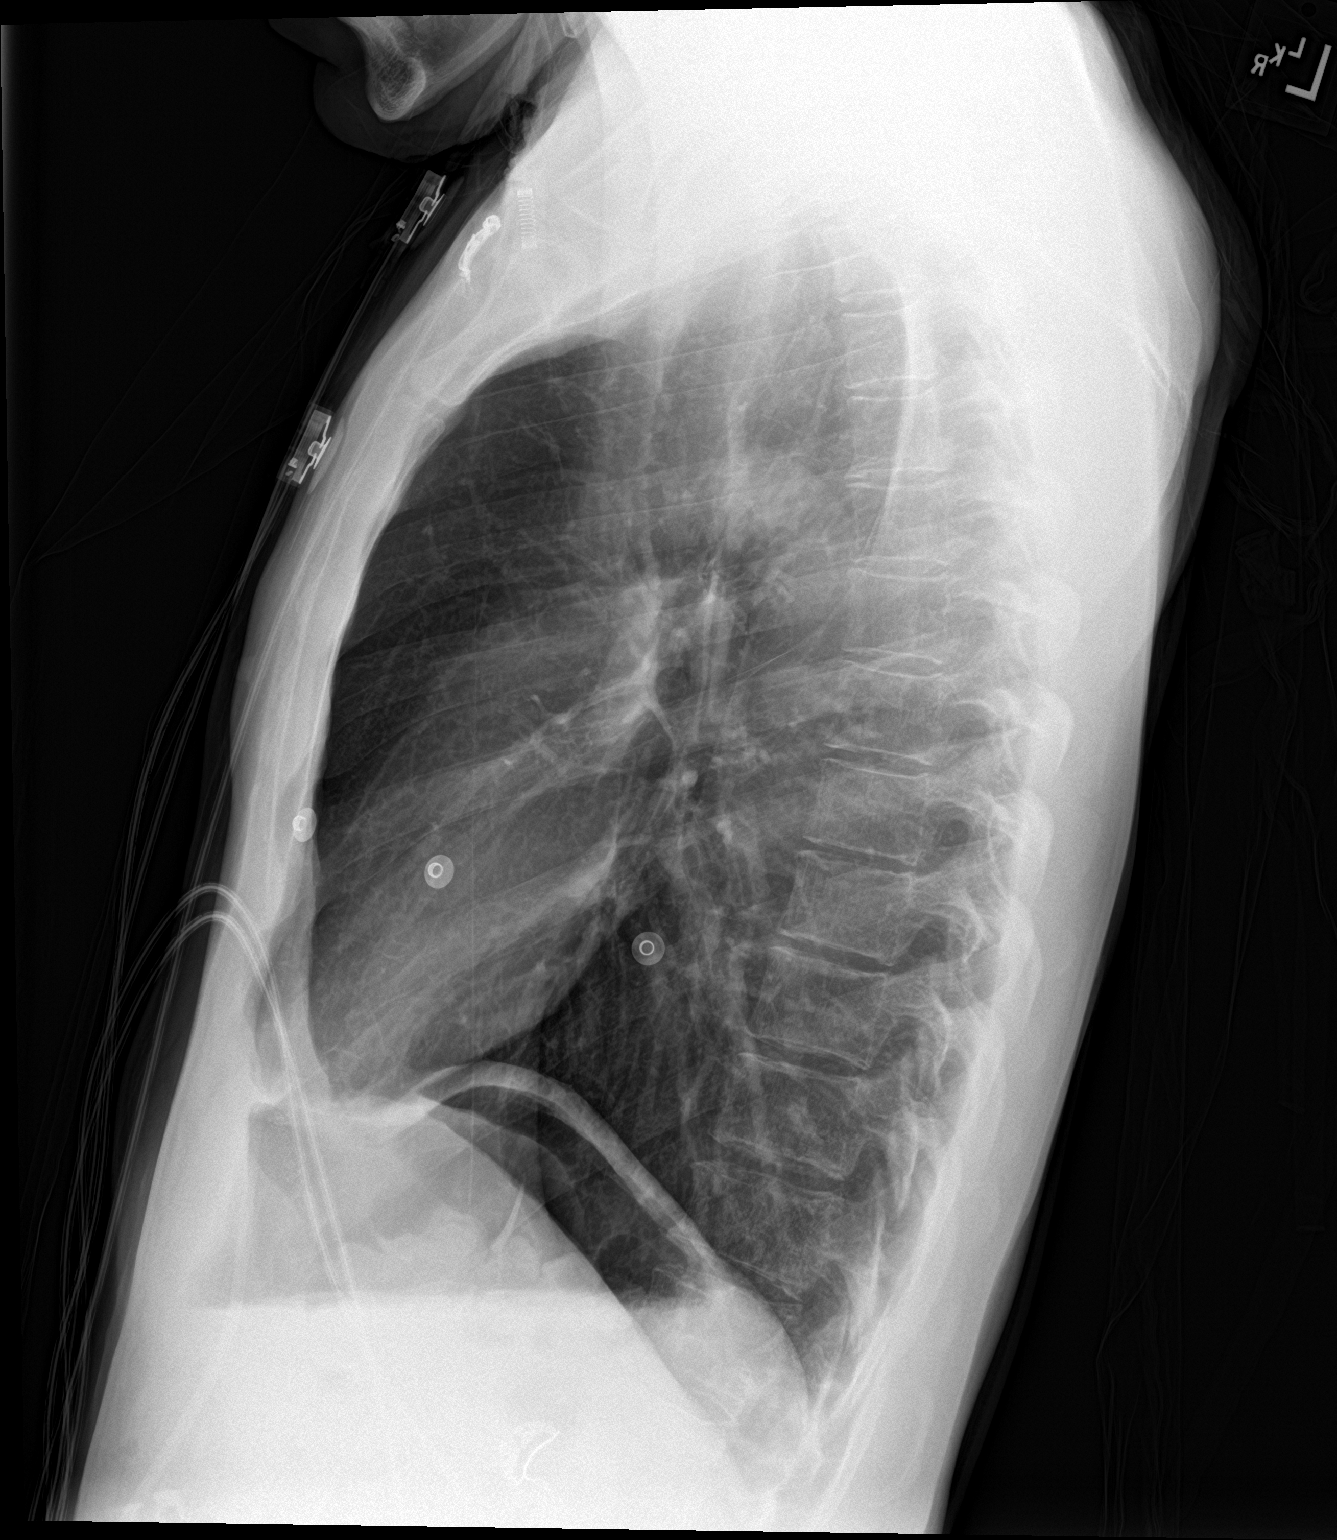

[2 of 2 positions shown; findings below may reference images not displayed]

FINDINGS: Nipple shadow density projects over the left chest. The lungs appear
clear. Cardiac and mediastinal margins appear normal. No pleural
effusion.
IMPRESSION: No active cardiopulmonary disease.
